# Patient Record
Sex: Male | Born: 1990
Health system: Southern US, Community
[De-identification: ages and names within clinical notes are randomized; demographics above are authoritative.]

## PROBLEM LIST (undated history)

## (undated) HISTORY — PX: HERNIA REPAIR: SHX51

---

## 2015-05-29 ENCOUNTER — Encounter: Payer: Self-pay | Admitting: Infectious Disease

## 2015-05-29 ENCOUNTER — Encounter (INDEPENDENT_AMBULATORY_CARE_PROVIDER_SITE_OTHER): Payer: Self-pay | Admitting: Infectious Disease

## 2015-05-29 VITALS — BP 117/73 | HR 73 | Temp 98.1°F | Resp 16 | Ht 71.0 in | Wt 123.2 lb

## 2015-05-29 DIAGNOSIS — Z006 Encounter for examination for normal comparison and control in clinical research program: Secondary | ICD-10-CM

## 2015-05-29 LAB — CBC WITH DIFFERENTIAL/PLATELET
BASOS ABS: 0 10*3/uL (ref 0.0–0.1)
BASOS PCT: 0 % (ref 0–1)
EOS ABS: 0.1 10*3/uL (ref 0.0–0.7)
Eosinophils Relative: 2 % (ref 0–5)
HEMATOCRIT: 43 % (ref 39.0–52.0)
HEMOGLOBIN: 14.3 g/dL (ref 13.0–17.0)
Lymphocytes Relative: 41 % (ref 12–46)
Lymphs Abs: 1.5 10*3/uL (ref 0.7–4.0)
MCH: 31.4 pg (ref 26.0–34.0)
MCHC: 33.3 g/dL (ref 30.0–36.0)
MCV: 94.3 fL (ref 78.0–100.0)
MPV: 9.5 fL (ref 8.6–12.4)
Monocytes Absolute: 0.4 10*3/uL (ref 0.1–1.0)
Monocytes Relative: 10 % (ref 3–12)
NEUTROS ABS: 1.7 10*3/uL (ref 1.7–7.7)
NEUTROS PCT: 47 % (ref 43–77)
Platelets: 185 10*3/uL (ref 150–400)
RBC: 4.56 MIL/uL (ref 4.22–5.81)
RDW: 12.8 % (ref 11.5–15.5)
WBC: 3.6 10*3/uL — ABNORMAL LOW (ref 4.0–10.5)

## 2015-05-29 NOTE — Progress Notes (Signed)
Chief complaint: evaluate for HPTN 083  Subjective:    Patient ID: Roy Wheeler, male    DOB: January 18, 1991, 25 y.o.   MRN: [id number]  HPI  25 year old young man here for CPE for HPTN 083.  He has been otherwise healthy. He had hx as child of a hernia repair but otherwise no significant medical history.  History reviewed. No pertinent past medical history.  Past Surgical History  Procedure Laterality Date  . Hernia repair      Family History  Problem Relation Age of Onset  . Autism Sister   . Asthma Father       Social History   Social History  . Marital Status: Single    Spouse Name: N/A  . Number of Children: N/A  . Years of Education: N/A   Social History Main Topics  . Smoking status: None  . Smokeless tobacco: None  . Alcohol Use: None  . Drug Use: None  . Sexual Activity: Not Asked   Other Topics Concern  . None   Social History Narrative  . None    No Known Allergies  No current outpatient prescriptions on file.   Review of Systems  Constitutional: Negative for fever, chills, diaphoresis, activity change, appetite change, fatigue and unexpected weight change.  HENT: Negative for congestion, rhinorrhea, sinus pressure, sneezing, sore throat and trouble swallowing.   Eyes: Negative for photophobia and visual disturbance.  Respiratory: Negative for cough, chest tightness, shortness of breath, wheezing and stridor.   Cardiovascular: Negative for chest pain, palpitations and leg swelling.  Gastrointestinal: Negative for nausea, vomiting, abdominal pain, diarrhea, constipation, blood in stool, abdominal distention and anal bleeding.  Genitourinary: Negative for dysuria, hematuria, flank pain and difficulty urinating.  Musculoskeletal: Negative for myalgias, back pain, joint swelling, arthralgias and gait problem.  Skin: Negative for color change, pallor, rash and wound.  Neurological: Negative for dizziness, tremors, weakness and light-headedness.    Hematological: Negative for adenopathy. Does not bruise/bleed easily.  Psychiatric/Behavioral: Negative for behavioral problems, confusion, sleep disturbance, dysphoric mood, decreased concentration and agitation.       Objective:   Physical Exam  Constitutional: He is oriented to person, place, and time. He appears well-developed and well-nourished.  HENT:  Head: Normocephalic and atraumatic.  Nose: Nose normal.  Mouth/Throat: Oropharynx is clear and moist. No oropharyngeal exudate.  Eyes: Conjunctivae and EOM are normal. Pupils are equal, round, and reactive to light. Right eye exhibits no discharge. Left eye exhibits no discharge. No scleral icterus.  Neck: Normal range of motion. Neck supple.  Cardiovascular: Normal rate, regular rhythm and normal heart sounds.  Exam reveals no gallop and no friction rub.   No murmur heard. Pulmonary/Chest: Effort normal and breath sounds normal. No respiratory distress. He has no wheezes. He has no rales. He exhibits no tenderness.  Abdominal: Soft. Bowel sounds are normal. He exhibits no distension. There is no tenderness. There is no rebound and no guarding.  Musculoskeletal: Normal range of motion. He exhibits no edema or tenderness.  Lymphadenopathy:       Head (right side): No submental, no submandibular, no tonsillar, no preauricular, no posterior auricular and no occipital adenopathy present.       Head (left side): No submental, no submandibular, no tonsillar, no preauricular, no posterior auricular and no occipital adenopathy present.    He has no cervical adenopathy.    He has no axillary adenopathy.       Right: No supraclavicular adenopathy present.  Left: No supraclavicular adenopathy present.  Neurological: He is alert and oriented to person, place, and time. He has normal reflexes. No cranial nerve deficit. Coordination normal.  Skin: Skin is warm and dry. No rash noted. No erythema. No pallor.  Psychiatric: He has a normal mood  and affect. His behavior is normal. Judgment and thought content normal.          Assessment & Plan:   Patient with completely normal exam. I also reviewed his EKG with NSR. He should be ready to enter study

## 2015-05-30 LAB — COMPLETE METABOLIC PANEL WITH GFR
ALT: 9 U/L (ref 9–46)
AST: 16 U/L (ref 10–40)
Albumin: 4.2 g/dL (ref 3.6–5.1)
Alkaline Phosphatase: 58 U/L (ref 40–115)
BILIRUBIN TOTAL: 0.5 mg/dL (ref 0.2–1.2)
BUN: 17 mg/dL (ref 7–25)
CHLORIDE: 105 mmol/L (ref 98–110)
CO2: 26 mmol/L (ref 20–31)
CREATININE: 0.79 mg/dL (ref 0.60–1.35)
Calcium: 9.3 mg/dL (ref 8.6–10.3)
GFR, Est Non African American: >89 mL/min (ref >=60)
Glucose, Bld: 71 mg/dL (ref 65–99)
Potassium: 3.9 mmol/L (ref 3.5–5.3)
Sodium: 142 mmol/L (ref 135–146)
TOTAL PROTEIN: 6.8 g/dL (ref 6.1–8.1)

## 2015-05-30 LAB — HEPATITIS C ANTIBODY: HCV AB: NEGATIVE

## 2015-05-30 LAB — HEPATITIS B SURFACE ANTIGEN: HEP B S AG: NEGATIVE

## 2015-05-30 LAB — HIV ANTIBODY (ROUTINE TESTING W REFLEX): HIV 1&2 Ab, 4th Generation: NONREACTIVE

## 2015-05-31 LAB — HIV-1 RNA QUANT-NO REFLEX-BLD

## 2015-06-04 NOTE — Progress Notes (Signed)
Study: A Phase 2b/3 Double Blind Safety and Efficacy Study of Injectable Cabotegravir compared to Daily Oral Tenofovir Disoproxil Fumarate/Emtricitabine (TDF/FTC), For Pre-Exposure Prophylaxis in HIV-Uninfected Cisgender Men and Transgender Women who have sex with Men.  Medication: Investigational Injectable Cabotegravir/placebo compared to Truvada/placebo. Duration: Around 4 years.  Roy Wheeler [name] is here for [name] screening visit. After verifying the correct version I explained/reviewed the informed consent in the language that he understood. Risk, benefits, responsibilities, and other options were reviewed. I answered his questions. Comprehension was assessed. He was given adequate time to consider his options. He verbalized understanding and signed the consent witnessed by me. I then gave him a copy of the consent.  HIV counseling was given including description of the testing and how it is done; explained HIV and how it is spread and ways to prevent it; Discussed the meaning of the possible test results and what impact the test results may have on the participant. PTID assigned. Confirmation of eligibility for screening was confirmed. Blood drawn and HIV rapid is negatvie. He received $50 gift card for screening visit. If deemed eligible and he is willing to participant in the study then anticipated entry visit is scheduled for 4/3. Tacey HeapElisha Kinsie Belford RN

## 2015-06-11 ENCOUNTER — Encounter (INDEPENDENT_AMBULATORY_CARE_PROVIDER_SITE_OTHER): Payer: Self-pay | Admitting: *Deleted

## 2015-06-11 VITALS — BP 135/80 | HR 91 | Temp 97.9°F | Resp 16 | Wt 121.5 lb

## 2015-06-11 DIAGNOSIS — Z006 Encounter for examination for normal comparison and control in clinical research program: Secondary | ICD-10-CM

## 2015-06-11 LAB — POCT URINALYSIS DIPSTICK
Bilirubin, UA: NEGATIVE
Blood, UA: NEGATIVE
GLUCOSE UA: NEGATIVE
KETONES UA: NEGATIVE
Nitrite, UA: NEGATIVE
PROTEIN UA: NEGATIVE
SPEC GRAV UA: 1.015
Urobilinogen, UA: 0.2
pH, UA: 7

## 2015-06-11 LAB — CBC WITH DIFFERENTIAL/PLATELET
BASOS PCT: 0 %
Basophils Absolute: 0 cells/uL (ref 0–200)
EOS PCT: 2 %
Eosinophils Absolute: 70 cells/uL (ref 15–500)
HCT: 45.9 % (ref 38.5–50.0)
Hemoglobin: 15.7 g/dL (ref 13.2–17.1)
LYMPHS PCT: 37 %
Lymphs Abs: 1295 cells/uL (ref 850–3900)
MCH: 32.6 pg (ref 27.0–33.0)
MCHC: 34.2 g/dL (ref 32.0–36.0)
MCV: 95.2 fL (ref 80.0–100.0)
MONOS PCT: 8 %
MPV: 9.8 fL (ref 7.5–12.5)
Monocytes Absolute: 280 cells/uL (ref 200–950)
NEUTROS ABS: 1855 {cells}/uL (ref 1500–7800)
Neutrophils Relative %: 53 %
PLATELETS: 174 10*3/uL (ref 140–400)
RBC: 4.82 MIL/uL (ref 4.20–5.80)
RDW: 12.6 % (ref 11.0–15.0)
WBC: 3.5 10*3/uL — AB (ref 3.8–10.8)

## 2015-06-11 LAB — PHOSPHORUS: PHOSPHORUS: 3.8 mg/dL (ref 2.5–4.5)

## 2015-06-11 LAB — COMPREHENSIVE METABOLIC PANEL
ALK PHOS: 60 U/L (ref 40–115)
ALT: 11 U/L (ref 9–46)
AST: 19 U/L (ref 10–40)
Albumin: 4.9 g/dL (ref 3.6–5.1)
BILIRUBIN TOTAL: 1 mg/dL (ref 0.2–1.2)
BUN: 15 mg/dL (ref 7–25)
CALCIUM: 9.5 mg/dL (ref 8.6–10.3)
CO2: 27 mmol/L (ref 20–31)
Chloride: 101 mmol/L (ref 98–110)
Creat: 0.78 mg/dL (ref 0.60–1.35)
GLUCOSE: 77 mg/dL (ref 65–99)
Potassium: 3.8 mmol/L (ref 3.5–5.3)
Sodium: 138 mmol/L (ref 135–146)
Total Protein: 7.6 g/dL (ref 6.1–8.1)

## 2015-06-11 LAB — LIPID PANEL
Cholesterol: 152 mg/dL (ref 125–200)
HDL: 46 mg/dL (ref >=40)
LDL CALC: 101 mg/dL (ref <130)
Total CHOL/HDL Ratio: 3.3 Ratio (ref <=5.0)
Triglycerides: 26 mg/dL (ref <150)
VLDL: 5 mg/dL (ref <30)

## 2015-06-11 LAB — CK: Total CK: 104 U/L (ref 7–232)

## 2015-06-11 LAB — HEPATITIS B CORE ANTIBODY, TOTAL: HEP B C TOTAL AB: NONREACTIVE

## 2015-06-11 LAB — HEPATITIS B SURFACE ANTIBODY,QUALITATIVE: HEP B S AB: POSITIVE — AB

## 2015-06-11 LAB — AMYLASE: AMYLASE: 55 U/L (ref 0–105)

## 2015-06-11 LAB — HIV ANTIBODY (ROUTINE TESTING W REFLEX): HIV: NONREACTIVE

## 2015-06-11 LAB — LIPASE: Lipase: 8 U/L (ref 7–60)

## 2015-06-11 NOTE — Progress Notes (Signed)
tStudy: A Phase 2b/3 Double Blind Safety and Efficacy Study of Injectable Cabotegravir compared to Daily Oral Tenofovir Disoproxil Fumarate/Emtricitabine (TDF/FTC), For Pre-Exposure Prophylaxis in HIV-Uninfected Cisgender Men and Transgender Women who have sex with Men.  Medication: Investigational Injectable Cabotegravir/placebo compared to Truvada/placebo. Duration: Around 4 years.  Roy Wheeler Roy Wheeler is here for HPTN 083 entry. After confirming eligibility and willingness to enroll we drew his blood. HIV rapid was negative. Questionnaires performed. He denies any new medications and/or symptoms. [facility] and potential side effects. He has my contact information should he have any questions/concerns. Study medication was dispensed. He received $50 giftcard + condoms and lube. Next appointment scheduled for Tuesday, April 18 at 10am. Tacey HeapElisha Epperson RN

## 2015-06-12 LAB — RPR TITER: RPR Titer: 1:8 {titer}

## 2015-06-12 LAB — FLUORESCENT TREPONEMAL AB(FTA)-IGG-BLD: Fluorescent Treponemal ABS: REACTIVE — AB

## 2015-06-12 LAB — GC/CHLAMYDIA PROBE AMP
CT PROBE, AMP APTIMA: NOT DETECTED
GC PROBE AMP APTIMA: NOT DETECTED

## 2015-06-12 LAB — RPR: RPR: REACTIVE — AB

## 2015-06-14 LAB — CT/NG RNA, TMA RECTAL
CHLAMYDIA TRACHOMATIS RNA: DETECTED — AB
NEISSERIA GONORRHOEAE RNA: DETECTED — AB

## 2015-06-15 ENCOUNTER — Encounter: Payer: Self-pay | Admitting: *Deleted

## 2015-06-15 NOTE — Progress Notes (Signed)
Patient ID: Roy Wheeler, male   DOB: April 22, 1990, 25 y.o.   MRN: [id number]  [facility] phone about his rectal GC/CT positive results. I explained his options for treatment and [facility] [phone]) for treatment. I gave him the number to make an appointment and faxed [phone]) the results to the health department. Orvan [name] stated the syphilis was known and that he was treated at the health department in November 2016. I noted this in the labs I sent to the health department. Tacey HeapElisha Sibbie Flammia RN

## 2015-06-15 NOTE — Progress Notes (Signed)
Thanks Elisha! 

## 2015-06-26 ENCOUNTER — Encounter (INDEPENDENT_AMBULATORY_CARE_PROVIDER_SITE_OTHER): Payer: Self-pay | Admitting: *Deleted

## 2015-06-26 VITALS — BP 112/74 | HR 67 | Temp 98.0°F | Wt 120.5 lb

## 2015-06-26 DIAGNOSIS — Z006 Encounter for examination for normal comparison and control in clinical research program: Secondary | ICD-10-CM

## 2015-06-26 LAB — CBC WITH DIFFERENTIAL/PLATELET
BASOS ABS: 0 {cells}/uL (ref 0–200)
Basophils Relative: 0 %
EOS ABS: 68 {cells}/uL (ref 15–500)
Eosinophils Relative: 2 %
HCT: 45.2 % (ref 38.5–50.0)
HEMOGLOBIN: 14.7 g/dL (ref 13.2–17.1)
Lymphocytes Relative: 48 %
Lymphs Abs: 1632 cells/uL (ref 850–3900)
MCH: 31.1 pg (ref 27.0–33.0)
MCHC: 32.5 g/dL (ref 32.0–36.0)
MCV: 95.6 fL (ref 80.0–100.0)
MONOS PCT: 7 %
MPV: 9.2 fL (ref 7.5–12.5)
Monocytes Absolute: 238 cells/uL (ref 200–950)
NEUTROS ABS: 1462 {cells}/uL — AB (ref 1500–7800)
Neutrophils Relative %: 43 %
Platelets: 199 10*3/uL (ref 140–400)
RBC: 4.73 MIL/uL (ref 4.20–5.80)
RDW: 12.7 % (ref 11.0–15.0)
WBC: 3.4 10*3/uL — ABNORMAL LOW (ref 3.8–10.8)

## 2015-06-26 NOTE — Progress Notes (Signed)
Study: A Phase 2b/3 Double Blind Safety and Efficacy Study of Injectable Cabotegravir compared to Daily Oral Tenofovir Disoproxil Fumarate/Emtricitabine (TDF/FTC), For Pre-Exposure Prophylaxis in HIV-Uninfected Cisgender Men and Transgender Women who have sex with Men.  Medication: Investigational Injectable Cabotegravir/placebo compared to Truvada/placebo. Duration: Around 4 years.  Roy [name] here for week 2 visit. Excellent adherence with medications. No missed doses. Pill count done and #45 remain of each study drug. Denies any adverse side effects from the medications. States that [facility] and received treatment for GC/CT. [facility]. No other complaints verbalized. Next visit scheduled for Jul 10, 2015 at 9:30am.

## 2015-06-27 LAB — COMPREHENSIVE METABOLIC PANEL
ALBUMIN: 4.8 g/dL (ref 3.6–5.1)
ALT: 13 U/L (ref 9–46)
AST: 21 U/L (ref 10–40)
Alkaline Phosphatase: 57 U/L (ref 40–115)
BUN: 18 mg/dL (ref 7–25)
CALCIUM: 9.3 mg/dL (ref 8.6–10.3)
CHLORIDE: 99 mmol/L (ref 98–110)
CO2: 29 mmol/L (ref 20–31)
Creat: 0.95 mg/dL (ref 0.60–1.35)
Glucose, Bld: 81 mg/dL (ref 65–99)
POTASSIUM: 3.6 mmol/L (ref 3.5–5.3)
Sodium: 138 mmol/L (ref 135–146)
TOTAL PROTEIN: 7.5 g/dL (ref 6.1–8.1)
Total Bilirubin: 0.8 mg/dL (ref 0.2–1.2)

## 2015-06-27 LAB — HIV ANTIBODY (ROUTINE TESTING W REFLEX): HIV 1&2 Ab, 4th Generation: NONREACTIVE

## 2015-06-27 LAB — AMYLASE: Amylase: 51 U/L (ref 0–105)

## 2015-06-27 LAB — PHOSPHORUS: PHOSPHORUS: 3.3 mg/dL (ref 2.5–4.5)

## 2015-06-27 LAB — LIPASE: LIPASE: 9 U/L (ref 7–60)

## 2015-06-27 LAB — CK: CK TOTAL: 92 U/L (ref 7–232)

## 2015-07-10 ENCOUNTER — Encounter (INDEPENDENT_AMBULATORY_CARE_PROVIDER_SITE_OTHER): Payer: Self-pay | Admitting: *Deleted

## 2015-07-10 VITALS — BP 118/79 | HR 68 | Temp 97.8°F | Wt 122.8 lb

## 2015-07-10 DIAGNOSIS — Z006 Encounter for examination for normal comparison and control in clinical research program: Secondary | ICD-10-CM

## 2015-07-10 LAB — CK: Total CK: 125 U/L (ref 7–232)

## 2015-07-10 LAB — CBC WITH DIFFERENTIAL/PLATELET
BASOS ABS: 0 {cells}/uL (ref 0–200)
Basophils Relative: 0 %
EOS PCT: 3 %
Eosinophils Absolute: 96 cells/uL (ref 15–500)
HEMATOCRIT: 43.1 % (ref 38.5–50.0)
HEMOGLOBIN: 14.8 g/dL (ref 13.2–17.1)
LYMPHS ABS: 1376 {cells}/uL (ref 850–3900)
LYMPHS PCT: 43 %
MCH: 32.5 pg (ref 27.0–33.0)
MCHC: 34.3 g/dL (ref 32.0–36.0)
MCV: 94.5 fL (ref 80.0–100.0)
MPV: 9.3 fL (ref 7.5–12.5)
Monocytes Absolute: 288 cells/uL (ref 200–950)
Monocytes Relative: 9 %
NEUTROS PCT: 45 %
Neutro Abs: 1440 cells/uL — ABNORMAL LOW (ref 1500–7800)
Platelets: 185 10*3/uL (ref 140–400)
RBC: 4.56 MIL/uL (ref 4.20–5.80)
RDW: 12.5 % (ref 11.0–15.0)
WBC: 3.2 10*3/uL — AB (ref 3.8–10.8)

## 2015-07-10 LAB — COMPREHENSIVE METABOLIC PANEL
ALK PHOS: 53 U/L (ref 40–115)
ALT: 12 U/L (ref 9–46)
AST: 20 U/L (ref 10–40)
Albumin: 4.4 g/dL (ref 3.6–5.1)
BUN: 14 mg/dL (ref 7–25)
CO2: 28 mmol/L (ref 20–31)
CREATININE: 0.9 mg/dL (ref 0.60–1.35)
Calcium: 9 mg/dL (ref 8.6–10.3)
Chloride: 103 mmol/L (ref 98–110)
GLUCOSE: 77 mg/dL (ref 65–99)
Potassium: 4 mmol/L (ref 3.5–5.3)
SODIUM: 139 mmol/L (ref 135–146)
Total Bilirubin: 0.7 mg/dL (ref 0.2–1.2)
Total Protein: 6.6 g/dL (ref 6.1–8.1)

## 2015-07-10 LAB — LIPASE: LIPASE: 12 U/L (ref 7–60)

## 2015-07-10 LAB — AMYLASE: AMYLASE: 40 U/L (ref 0–105)

## 2015-07-10 LAB — PHOSPHORUS: PHOSPHORUS: 3.7 mg/dL (ref 2.5–4.5)

## 2015-07-10 NOTE — Progress Notes (Signed)
Study: A Phase 2b/3 Double Blind Safety and Efficacy Study of Injectable Cabotegravir compared to Daily Oral Tenofovir Disoproxil Fumarate/Emtricitabine (TDF/FTC), For Pre-Exposure Prophylaxis in HIV-Uninfected Cisgender Men and Transgender Women who have sex with Men.  Medication: Investigational Injectable Cabotegravir/placebo compared to Truvada/placebo. Duration: Around 4 years.  Roy [name] here for his week 4 visit. Excellent adherence with study medication. Denies any missed doses. Pill count done, #30 remaining. Rapid HIV non-reactive. No new complaints or concerns verbalized. He will return next week for his first Cabotegrivir/placebo injection.

## 2015-07-11 LAB — HIV ANTIBODY (ROUTINE TESTING W REFLEX): HIV: NONREACTIVE

## 2015-07-16 ENCOUNTER — Encounter (INDEPENDENT_AMBULATORY_CARE_PROVIDER_SITE_OTHER): Payer: Self-pay | Admitting: *Deleted

## 2015-07-16 VITALS — BP 109/74 | HR 61 | Temp 98.2°F | Wt 120.0 lb

## 2015-07-16 DIAGNOSIS — Z006 Encounter for examination for normal comparison and control in clinical research program: Secondary | ICD-10-CM

## 2015-07-16 LAB — CBC WITH DIFFERENTIAL/PLATELET
BASOS PCT: 0 %
Basophils Absolute: 0 cells/uL (ref 0–200)
EOS ABS: 88 {cells}/uL (ref 15–500)
Eosinophils Relative: 2 %
HEMATOCRIT: 44.2 % (ref 38.5–50.0)
Hemoglobin: 14.8 g/dL (ref 13.2–17.1)
LYMPHS PCT: 29 %
Lymphs Abs: 1276 cells/uL (ref 850–3900)
MCH: 32 pg (ref 27.0–33.0)
MCHC: 33.5 g/dL (ref 32.0–36.0)
MCV: 95.5 fL (ref 80.0–100.0)
MONO ABS: 264 {cells}/uL (ref 200–950)
MONOS PCT: 6 %
MPV: 9.3 fL (ref 7.5–12.5)
NEUTROS PCT: 63 %
Neutro Abs: 2772 cells/uL (ref 1500–7800)
PLATELETS: 204 10*3/uL (ref 140–400)
RBC: 4.63 MIL/uL (ref 4.20–5.80)
RDW: 12.8 % (ref 11.0–15.0)
WBC: 4.4 10*3/uL (ref 3.8–10.8)

## 2015-07-16 NOTE — Progress Notes (Signed)
Study: A Phase 2b/3 Double Blind Safety and Efficacy Study of Injectable Cabotegravir compared to Daily Oral Tenofovir Disoproxil Fumarate/Emtricitabine (TDF/FTC), For Pre-Exposure Prophylaxis in HIV-Uninfected Cisgender Men and Transgender Women who have sex with Men.  Medication: Investigational Injectable Cabotegravir/placebo compared to Truvada/placebo. Duration: Around 4 years.  Orvan [name] here for week 5 visit.  No new complaints or concerns verbalized. Excellent adherence with oral study medication. No missed doses. Pill count done, #24 remaining of each medication. Rapid HIV non-reactive. He received his first Cabotegravir/placebo injection, to the (R) gluteal muscle. Site unremarkable. Reviewed possible s/sx's of injection site reactions. Next appointment scheduled for 5/16 @ 10:00am.

## 2015-07-17 LAB — COMPREHENSIVE METABOLIC PANEL
ALBUMIN: 4.4 g/dL (ref 3.6–5.1)
ALT: 10 U/L (ref 9–46)
AST: 19 U/L (ref 10–40)
Alkaline Phosphatase: 55 U/L (ref 40–115)
BILIRUBIN TOTAL: 0.7 mg/dL (ref 0.2–1.2)
BUN: 17 mg/dL (ref 7–25)
CO2: 28 mmol/L (ref 20–31)
CREATININE: 0.86 mg/dL (ref 0.60–1.35)
Calcium: 9.4 mg/dL (ref 8.6–10.3)
Chloride: 102 mmol/L (ref 98–110)
Glucose, Bld: 88 mg/dL (ref 65–99)
Potassium: 4 mmol/L (ref 3.5–5.3)
SODIUM: 139 mmol/L (ref 135–146)
TOTAL PROTEIN: 7 g/dL (ref 6.1–8.1)

## 2015-07-17 LAB — AMYLASE: Amylase: 44 U/L (ref 0–105)

## 2015-07-17 LAB — PHOSPHORUS: Phosphorus: 3.4 mg/dL (ref 2.5–4.5)

## 2015-07-17 LAB — CK: CK TOTAL: 113 U/L (ref 7–232)

## 2015-07-17 LAB — HIV ANTIBODY (ROUTINE TESTING W REFLEX): HIV 1&2 Ab, 4th Generation: NONREACTIVE

## 2015-07-17 LAB — LIPASE: Lipase: 7 U/L (ref 7–60)

## 2015-07-23 ENCOUNTER — Encounter (INDEPENDENT_AMBULATORY_CARE_PROVIDER_SITE_OTHER): Payer: Self-pay | Admitting: *Deleted

## 2015-07-23 VITALS — BP 106/70 | HR 91 | Temp 97.9°F | Wt 121.5 lb

## 2015-07-23 DIAGNOSIS — Z006 Encounter for examination for normal comparison and control in clinical research program: Secondary | ICD-10-CM

## 2015-07-23 LAB — CBC WITH DIFFERENTIAL/PLATELET
BASOS ABS: 0 {cells}/uL (ref 0–200)
Basophils Relative: 0 %
EOS PCT: 2 %
Eosinophils Absolute: 96 cells/uL (ref 15–500)
HCT: 41.4 % (ref 38.5–50.0)
HEMOGLOBIN: 13.9 g/dL (ref 13.2–17.1)
LYMPHS ABS: 1344 {cells}/uL (ref 850–3900)
Lymphocytes Relative: 28 %
MCH: 31.7 pg (ref 27.0–33.0)
MCHC: 33.6 g/dL (ref 32.0–36.0)
MCV: 94.3 fL (ref 80.0–100.0)
MPV: 9.3 fL (ref 7.5–12.5)
Monocytes Absolute: 336 cells/uL (ref 200–950)
Monocytes Relative: 7 %
NEUTROS ABS: 3024 {cells}/uL (ref 1500–7800)
Neutrophils Relative %: 63 %
Platelets: 198 10*3/uL (ref 140–400)
RBC: 4.39 MIL/uL (ref 4.20–5.80)
RDW: 12.3 % (ref 11.0–15.0)
WBC: 4.8 10*3/uL (ref 3.8–10.8)

## 2015-07-23 LAB — COMPREHENSIVE METABOLIC PANEL
ALBUMIN: 4.1 g/dL (ref 3.6–5.1)
ALK PHOS: 56 U/L (ref 40–115)
ALT: 9 U/L (ref 9–46)
AST: 18 U/L (ref 10–40)
BILIRUBIN TOTAL: 0.6 mg/dL (ref 0.2–1.2)
BUN: 14 mg/dL (ref 7–25)
CO2: 28 mmol/L (ref 20–31)
CREATININE: 0.76 mg/dL (ref 0.60–1.35)
Calcium: 8.8 mg/dL (ref 8.6–10.3)
Chloride: 103 mmol/L (ref 98–110)
Glucose, Bld: 86 mg/dL (ref 65–99)
Potassium: 3.9 mmol/L (ref 3.5–5.3)
SODIUM: 142 mmol/L (ref 135–146)
TOTAL PROTEIN: 6.5 g/dL (ref 6.1–8.1)

## 2015-07-23 LAB — CK: CK TOTAL: 74 U/L (ref 7–232)

## 2015-07-23 LAB — PHOSPHORUS: PHOSPHORUS: 2.5 mg/dL (ref 2.5–4.5)

## 2015-07-23 LAB — AMYLASE: Amylase: 42 U/L (ref 0–105)

## 2015-07-23 LAB — HIV ANTIBODY (ROUTINE TESTING W REFLEX): HIV 1&2 Ab, 4th Generation: NONREACTIVE

## 2015-07-23 LAB — LIPASE: Lipase: 11 U/L (ref 7–60)

## 2015-07-23 NOTE — Progress Notes (Signed)
Study: A Phase 2b/3 Double Blind Safety and Efficacy Study of Injectable Cabotegravir compared to Daily Oral Tenofovir Disoproxil Fumarate/Emtricitabine (TDF/FTC), For Pre-Exposure Prophylaxis in HIV-Uninfected Cisgender Men and Transgender Women who have sex with Men.  Medication: Investigational Injectable Cabotegravir/placebo compared to Truvada/placebo. Duration: Around 4 years.   Roy [name] here for week 6 visit. He did have some mild tenderness at the injection site which lasted only a day.  However, he did note tenderness again over the weekend at the injection site after playing with his cousins and "doing a cartwheel". States discomfort/tenderness resolved after taking ibuprofen. Denies any tenderness at today's visit.  No other concerns or complaint verbalized.  His next visit is scheduled for 6/6 at 10:00am.

## 2015-08-14 ENCOUNTER — Encounter (INDEPENDENT_AMBULATORY_CARE_PROVIDER_SITE_OTHER): Payer: Self-pay | Admitting: *Deleted

## 2015-08-14 VITALS — BP 118/74 | HR 76 | Temp 98.0°F | Wt 121.0 lb

## 2015-08-14 DIAGNOSIS — Z006 Encounter for examination for normal comparison and control in clinical research program: Secondary | ICD-10-CM

## 2015-08-14 LAB — COMPREHENSIVE METABOLIC PANEL
ALK PHOS: 67 U/L (ref 40–115)
ALT: 12 U/L (ref 9–46)
AST: 15 U/L (ref 10–40)
Albumin: 4.2 g/dL (ref 3.6–5.1)
BUN: 13 mg/dL (ref 7–25)
CHLORIDE: 101 mmol/L (ref 98–110)
CO2: 24 mmol/L (ref 20–31)
Calcium: 9 mg/dL (ref 8.6–10.3)
Creat: 0.84 mg/dL (ref 0.60–1.35)
GLUCOSE: 85 mg/dL (ref 65–99)
POTASSIUM: 3.8 mmol/L (ref 3.5–5.3)
Sodium: 138 mmol/L (ref 135–146)
Total Bilirubin: 0.5 mg/dL (ref 0.2–1.2)
Total Protein: 7.1 g/dL (ref 6.1–8.1)

## 2015-08-14 LAB — CBC WITH DIFFERENTIAL/PLATELET
BASOS ABS: 62 {cells}/uL (ref 0–200)
BASOS PCT: 1 %
EOS PCT: 4 %
Eosinophils Absolute: 248 cells/uL (ref 15–500)
HCT: 42.1 % (ref 38.5–50.0)
Hemoglobin: 14.2 g/dL (ref 13.2–17.1)
LYMPHS PCT: 26 %
Lymphs Abs: 1612 cells/uL (ref 850–3900)
MCH: 31.8 pg (ref 27.0–33.0)
MCHC: 33.7 g/dL (ref 32.0–36.0)
MCV: 94.2 fL (ref 80.0–100.0)
MONOS PCT: 9 %
MPV: 8.5 fL (ref 7.5–12.5)
Monocytes Absolute: 558 cells/uL (ref 200–950)
NEUTROS ABS: 3720 {cells}/uL (ref 1500–7800)
Neutrophils Relative %: 60 %
PLATELETS: 275 10*3/uL (ref 140–400)
RBC: 4.47 MIL/uL (ref 4.20–5.80)
RDW: 12.1 % (ref 11.0–15.0)
WBC: 6.2 10*3/uL (ref 3.8–10.8)

## 2015-08-14 LAB — AMYLASE: AMYLASE: 46 U/L (ref 0–105)

## 2015-08-14 LAB — PHOSPHORUS: Phosphorus: 3.2 mg/dL (ref 2.5–4.5)

## 2015-08-14 LAB — CK: CK TOTAL: 60 U/L (ref 7–232)

## 2015-08-14 LAB — LIPASE: Lipase: 15 U/L (ref 7–60)

## 2015-08-14 NOTE — Progress Notes (Signed)
Study: A Phase 2b/3 Double Blind Safety and Efficacy Study of Injectable Cabotegravir compared to Daily Oral Tenofovir Disoproxil Fumarate/Emtricitabine (TDF/FTC), For Pre-Exposure Prophylaxis in HIV-Uninfected Cisgender Men and Transgender Women who have sex with Men.  Medication: Investigational Injectable Cabotegravir/placebo compared to Truvada/placebo. Duration: Around 4 years.   Roy Wheeler is here for HPTN week 9. He has had a few days of constipation but has resolved. Now has hemorrhoids and is using Preparation H. HIV rapid is negative. Returned pills with #32 remaining. Questionnaires completed. Injection given in Left gluteal muscle with no problems. Oral study meds were dispensed. He received $50 gift card for visit along with condoms/lube. Next appointment is on Monday. Tacey HeapElisha Epperson RN

## 2015-08-15 LAB — HIV ANTIBODY (ROUTINE TESTING W REFLEX): HIV 1&2 Ab, 4th Generation: NONREACTIVE

## 2015-08-20 ENCOUNTER — Encounter (INDEPENDENT_AMBULATORY_CARE_PROVIDER_SITE_OTHER): Payer: Self-pay | Admitting: *Deleted

## 2015-08-20 VITALS — BP 101/64 | HR 85 | Temp 97.8°F | Wt 118.5 lb

## 2015-08-20 DIAGNOSIS — Z006 Encounter for examination for normal comparison and control in clinical research program: Secondary | ICD-10-CM

## 2015-08-20 LAB — CBC WITH DIFFERENTIAL/PLATELET
BASOS ABS: 0 {cells}/uL (ref 0–200)
Basophils Relative: 0 %
EOS ABS: 165 {cells}/uL (ref 15–500)
Eosinophils Relative: 3 %
HEMATOCRIT: 38.9 % (ref 38.5–50.0)
Hemoglobin: 13 g/dL — ABNORMAL LOW (ref 13.2–17.1)
LYMPHS PCT: 25 %
Lymphs Abs: 1375 cells/uL (ref 850–3900)
MCH: 30.9 pg (ref 27.0–33.0)
MCHC: 33.4 g/dL (ref 32.0–36.0)
MCV: 92.4 fL (ref 80.0–100.0)
MONO ABS: 440 {cells}/uL (ref 200–950)
MPV: 8.9 fL (ref 7.5–12.5)
Monocytes Relative: 8 %
NEUTROS PCT: 64 %
Neutro Abs: 3520 cells/uL (ref 1500–7800)
Platelets: 246 10*3/uL (ref 140–400)
RBC: 4.21 MIL/uL (ref 4.20–5.80)
RDW: 12.2 % (ref 11.0–15.0)
WBC: 5.5 10*3/uL (ref 3.8–10.8)

## 2015-08-20 LAB — COMPREHENSIVE METABOLIC PANEL
ALK PHOS: 64 U/L (ref 40–115)
ALT: 14 U/L (ref 9–46)
AST: 28 U/L (ref 10–40)
Albumin: 3.8 g/dL (ref 3.6–5.1)
BILIRUBIN TOTAL: 0.7 mg/dL (ref 0.2–1.2)
BUN: 13 mg/dL (ref 7–25)
CALCIUM: 9 mg/dL (ref 8.6–10.3)
CO2: 30 mmol/L (ref 20–31)
Chloride: 102 mmol/L (ref 98–110)
Creat: 0.83 mg/dL (ref 0.60–1.35)
Glucose, Bld: 99 mg/dL (ref 65–99)
POTASSIUM: 3.8 mmol/L (ref 3.5–5.3)
Sodium: 139 mmol/L (ref 135–146)
Total Protein: 7 g/dL (ref 6.1–8.1)

## 2015-08-20 LAB — LIPASE: Lipase: 6 U/L — ABNORMAL LOW (ref 7–60)

## 2015-08-20 LAB — PHOSPHORUS: PHOSPHORUS: 3.1 mg/dL (ref 2.5–4.5)

## 2015-08-20 LAB — AMYLASE: AMYLASE: 33 U/L (ref 0–105)

## 2015-08-20 LAB — CK: Total CK: 443 U/L — ABNORMAL HIGH (ref 7–232)

## 2015-08-20 NOTE — Progress Notes (Signed)
Study: A Phase 2b/3 Double Blind Safety and Efficacy Study of Injectable Cabotegravir compared to Daily Oral Tenofovir Disoproxil Fumarate/Emtricitabine (TDF/FTC), For Pre-Exposure Prophylaxis in HIV-Uninfected Cisgender Men and Transgender Women who have sex with Men.  Medication: Investigational Injectable Cabotegravir/placebo compared to Truvada/placebo. Duration: Around 4 years.   Roy Wheeler here for week 10 visit. He did have some mild tenderness at the injection site for approximately 4days. Stated improvement from discomfort with ibuprofen. No other complaints/concerns verbalized. Rapid HIV non-reactive. Scheduled to return in July for his next study visit/injection.

## 2015-08-21 LAB — HIV ANTIBODY (ROUTINE TESTING W REFLEX): HIV 1&2 Ab, 4th Generation: NONREACTIVE

## 2015-08-27 ENCOUNTER — Encounter (INDEPENDENT_AMBULATORY_CARE_PROVIDER_SITE_OTHER): Payer: Self-pay | Admitting: *Deleted

## 2015-08-27 DIAGNOSIS — Z006 Encounter for examination for normal comparison and control in clinical research program: Secondary | ICD-10-CM

## 2015-08-27 LAB — CK: Total CK: 85 U/L (ref 7–232)

## 2015-08-27 NOTE — Addendum Note (Signed)
Addended by: Nicolasa DuckingEPPERSON, ELISHA S on: 08/27/2015 09:37 AM   Modules accepted: Orders

## 2015-08-27 NOTE — Progress Notes (Signed)
Study: A Phase 2b/3 Double Blind Safety and Efficacy Study of Injectable Cabotegravir compared to Daily Oral Tenofovir Disoproxil Fumarate/Emtricitabine (TDF/FTC), For Pre-Exposure Prophylaxis in HIV-Uninfected Cisgender Men and Transgender Women who have sex with Men.  Medication: Investigational Injectable Cabotegravir/placebo compared to Truvada/placebo. Duration: Around 4 years.  Roy Wheeler is here for [facility] lab draw only. It was slightly elevated at last visit and we are repeating to see where the levels are today. He denies any tenderness or pain at injection site. He received $15 gift card for visit. Tacey HeapElisha Jerzey Komperda RN

## 2015-10-08 ENCOUNTER — Encounter (INDEPENDENT_AMBULATORY_CARE_PROVIDER_SITE_OTHER): Payer: Self-pay | Admitting: *Deleted

## 2015-10-08 VITALS — BP 107/75 | HR 93 | Temp 98.8°F | Wt 121.2 lb

## 2015-10-08 DIAGNOSIS — Z006 Encounter for examination for normal comparison and control in clinical research program: Secondary | ICD-10-CM

## 2015-10-08 LAB — CBC WITH DIFFERENTIAL/PLATELET
BASOS ABS: 0 {cells}/uL (ref 0–200)
BASOS PCT: 0 %
EOS ABS: 68 {cells}/uL (ref 15–500)
Eosinophils Relative: 1 %
HEMATOCRIT: 40.4 % (ref 38.5–50.0)
Hemoglobin: 13.6 g/dL (ref 13.2–17.1)
LYMPHS PCT: 24 %
Lymphs Abs: 1632 cells/uL (ref 850–3900)
MCH: 31.1 pg (ref 27.0–33.0)
MCHC: 33.7 g/dL (ref 32.0–36.0)
MCV: 92.2 fL (ref 80.0–100.0)
MONO ABS: 340 {cells}/uL (ref 200–950)
MPV: 9.1 fL (ref 7.5–12.5)
Monocytes Relative: 5 %
NEUTROS PCT: 70 %
Neutro Abs: 4760 cells/uL (ref 1500–7800)
Platelets: 245 10*3/uL (ref 140–400)
RBC: 4.38 MIL/uL (ref 4.20–5.80)
RDW: 12.4 % (ref 11.0–15.0)
WBC: 6.8 10*3/uL (ref 3.8–10.8)

## 2015-10-08 NOTE — Progress Notes (Signed)
Study: A Phase 2b/3 Double Blind Safety and Efficacy Study of Injectable Cabotegravir compared to Daily Oral Tenofovir Disoproxil Fumarate/Emtricitabine (TDF/FTC), For Pre-Exposure Prophylaxis in HIV-Uninfected Cisgender Men and Transgender Women who have sex with Men.  Medication: Investigational Injectable Cabotegravir/placebo compared to Truvada/placebo. Duration: Around 4 years.  Roy Wheeler is her for week 17. He has no new complaints and has no changes to his medications. He is now working 2 jobs and switched to taking his TDF/FTC/Placebo in the morning. #35 pills remain and states he missed one pill. Questionnaires and Interview completed. HIV rapid is negative. Medication was dispensed and injection given in Right Gluteal muscle with no problems. He received $50 gift card for visit. Next appointment scheduled for October 22, 2015. Roy Heap RN

## 2015-10-09 LAB — COMPREHENSIVE METABOLIC PANEL
ALBUMIN: 4.4 g/dL (ref 3.6–5.1)
ALT: 10 U/L (ref 9–46)
AST: 18 U/L (ref 10–40)
Alkaline Phosphatase: 65 U/L (ref 40–115)
BILIRUBIN TOTAL: 0.5 mg/dL (ref 0.2–1.2)
BUN: 15 mg/dL (ref 7–25)
CALCIUM: 9.2 mg/dL (ref 8.6–10.3)
CO2: 28 mmol/L (ref 20–31)
Chloride: 102 mmol/L (ref 98–110)
Creat: 0.88 mg/dL (ref 0.60–1.35)
Glucose, Bld: 67 mg/dL (ref 65–99)
POTASSIUM: 3.5 mmol/L (ref 3.5–5.3)
Sodium: 138 mmol/L (ref 135–146)
Total Protein: 7.1 g/dL (ref 6.1–8.1)

## 2015-10-09 LAB — PHOSPHORUS: PHOSPHORUS: 2.5 mg/dL (ref 2.5–4.5)

## 2015-10-09 LAB — CK: Total CK: 114 U/L (ref 7–232)

## 2015-10-09 LAB — LIPASE: LIPASE: 11 U/L (ref 7–60)

## 2015-10-09 LAB — AMYLASE: Amylase: 42 U/L (ref 0–105)

## 2015-10-09 LAB — HIV ANTIBODY (ROUTINE TESTING W REFLEX): HIV 1&2 Ab, 4th Generation: NONREACTIVE

## 2015-10-22 ENCOUNTER — Encounter (INDEPENDENT_AMBULATORY_CARE_PROVIDER_SITE_OTHER): Payer: Self-pay | Admitting: *Deleted

## 2015-10-22 VITALS — BP 104/69 | HR 81 | Temp 97.9°F | Wt 120.5 lb

## 2015-10-22 DIAGNOSIS — Z006 Encounter for examination for normal comparison and control in clinical research program: Secondary | ICD-10-CM

## 2015-10-22 LAB — CBC WITH DIFFERENTIAL/PLATELET
BASOS PCT: 0 %
Basophils Absolute: 0 cells/uL (ref 0–200)
EOS PCT: 1 %
Eosinophils Absolute: 51 cells/uL (ref 15–500)
HEMATOCRIT: 39.2 % (ref 38.5–50.0)
HEMOGLOBIN: 13.1 g/dL — AB (ref 13.2–17.1)
LYMPHS ABS: 2193 {cells}/uL (ref 850–3900)
Lymphocytes Relative: 43 %
MCH: 31.2 pg (ref 27.0–33.0)
MCHC: 33.4 g/dL (ref 32.0–36.0)
MCV: 93.3 fL (ref 80.0–100.0)
MPV: 8.8 fL (ref 7.5–12.5)
Monocytes Absolute: 459 cells/uL (ref 200–950)
Monocytes Relative: 9 %
NEUTROS ABS: 2397 {cells}/uL (ref 1500–7800)
NEUTROS PCT: 47 %
Platelets: 257 10*3/uL (ref 140–400)
RBC: 4.2 MIL/uL (ref 4.20–5.80)
RDW: 12.4 % (ref 11.0–15.0)
WBC: 5.1 10*3/uL (ref 3.8–10.8)

## 2015-10-22 LAB — COMPREHENSIVE METABOLIC PANEL
ALK PHOS: 62 U/L (ref 40–115)
ALT: 10 U/L (ref 9–46)
AST: 17 U/L (ref 10–40)
Albumin: 4.2 g/dL (ref 3.6–5.1)
BILIRUBIN TOTAL: 0.4 mg/dL (ref 0.2–1.2)
BUN: 13 mg/dL (ref 7–25)
CALCIUM: 9.2 mg/dL (ref 8.6–10.3)
CO2: 31 mmol/L (ref 20–31)
Chloride: 103 mmol/L (ref 98–110)
Creat: 1.04 mg/dL (ref 0.60–1.35)
GLUCOSE: 75 mg/dL (ref 65–99)
Potassium: 4.3 mmol/L (ref 3.5–5.3)
Sodium: 139 mmol/L (ref 135–146)
Total Protein: 7.3 g/dL (ref 6.1–8.1)

## 2015-10-22 LAB — CK: Total CK: 93 U/L (ref 7–232)

## 2015-10-22 LAB — PHOSPHORUS: Phosphorus: 3.3 mg/dL (ref 2.5–4.5)

## 2015-10-22 LAB — AMYLASE: Amylase: 43 U/L (ref 0–105)

## 2015-10-22 LAB — LIPASE: Lipase: 12 U/L (ref 7–60)

## 2015-10-22 NOTE — Progress Notes (Signed)
Study: A Phase 2b/3 Double Blind Safety and Efficacy Study of Injectable Cabotegravir compared to Daily Oral Tenofovir Disoproxil Fumarate/Emtricitabine (TDF/FTC), For Pre-Exposure Prophylaxis in HIV-Uninfected Cisgender Men and Transgender Women who have sex with Men.  Medication: Investigational Injectable Cabotegravir/placebo compared to Truvada/placebo. Duration: Around 4 years.   Orvan [name] here for week 19 visit. Verbalized mild tenderness post injection. Denied any redness, swelling or bruising at site. No other complaints verbalized. No missed doses of his oral study medication. Rapid HIV non-reactive. Next visit scheduled for 9/25 @ 10:30am.

## 2015-10-23 LAB — HIV ANTIBODY (ROUTINE TESTING W REFLEX): HIV: NONREACTIVE

## 2015-10-31 ENCOUNTER — Telehealth: Payer: Self-pay

## 2015-12-03 ENCOUNTER — Encounter (INDEPENDENT_AMBULATORY_CARE_PROVIDER_SITE_OTHER): Payer: Self-pay | Admitting: *Deleted

## 2015-12-03 VITALS — BP 105/70 | HR 64 | Temp 97.4°F | Wt 119.5 lb

## 2015-12-03 DIAGNOSIS — Z006 Encounter for examination for normal comparison and control in clinical research program: Secondary | ICD-10-CM

## 2015-12-03 LAB — CBC WITH DIFFERENTIAL/PLATELET
Basophils Absolute: 0 cells/uL (ref 0–200)
Basophils Relative: 0 %
EOS ABS: 36 {cells}/uL (ref 15–500)
Eosinophils Relative: 1 %
HEMATOCRIT: 38.9 % (ref 38.5–50.0)
Hemoglobin: 13.1 g/dL — ABNORMAL LOW (ref 13.2–17.1)
LYMPHS PCT: 34 %
Lymphs Abs: 1224 cells/uL (ref 850–3900)
MCH: 31 pg (ref 27.0–33.0)
MCHC: 33.7 g/dL (ref 32.0–36.0)
MCV: 92.2 fL (ref 80.0–100.0)
MONO ABS: 216 {cells}/uL (ref 200–950)
MONOS PCT: 6 %
MPV: 9.5 fL (ref 7.5–12.5)
NEUTROS PCT: 59 %
Neutro Abs: 2124 cells/uL (ref 1500–7800)
Platelets: 192 10*3/uL (ref 140–400)
RBC: 4.22 MIL/uL (ref 4.20–5.80)
RDW: 12.8 % (ref 11.0–15.0)
WBC: 3.6 10*3/uL — ABNORMAL LOW (ref 3.8–10.8)

## 2015-12-03 LAB — COMPREHENSIVE METABOLIC PANEL
ALBUMIN: 4.3 g/dL (ref 3.6–5.1)
ALT: 10 U/L (ref 9–46)
AST: 18 U/L (ref 10–40)
Alkaline Phosphatase: 50 U/L (ref 40–115)
BILIRUBIN TOTAL: 0.7 mg/dL (ref 0.2–1.2)
BUN: 15 mg/dL (ref 7–25)
CALCIUM: 9.3 mg/dL (ref 8.6–10.3)
CHLORIDE: 105 mmol/L (ref 98–110)
CO2: 29 mmol/L (ref 20–31)
Creat: 0.85 mg/dL (ref 0.60–1.35)
GLUCOSE: 85 mg/dL (ref 65–99)
Potassium: 3.5 mmol/L (ref 3.5–5.3)
Sodium: 142 mmol/L (ref 135–146)
Total Protein: 7 g/dL (ref 6.1–8.1)

## 2015-12-03 LAB — AMYLASE: Amylase: 39 U/L (ref 0–105)

## 2015-12-03 LAB — CK: Total CK: 130 U/L (ref 7–232)

## 2015-12-03 LAB — LIPASE: Lipase: 10 U/L (ref 7–60)

## 2015-12-03 LAB — PHOSPHORUS: Phosphorus: 3.5 mg/dL (ref 2.5–4.5)

## 2015-12-03 LAB — HIV ANTIBODY (ROUTINE TESTING W REFLEX): HIV 1&2 Ab, 4th Generation: NONREACTIVE

## 2015-12-03 NOTE — Progress Notes (Signed)
Study: A Phase 2b/3 Double Blind Safety and Efficacy Study of Injectable Cabotegravir compared to Daily Oral Tenofovir Disoproxil Fumarate/Emtricitabine (TDF/FTC), For Pre-Exposure Prophylaxis in HIV-Uninfected Cisgender Men and Transgender Women who have sex with Men.  Medication: Investigational Injectable Cabotegravir/placebo compared to Truvada/placebo. Duration: Around 4 years.  Orvan [name] is here for week 25 visit. No new complaints or concerns verbalized. Rapid HIV non-reactive. Cabotegravir/placebo injection given (L) gluteal muscle. Site unremarkable. States that he did miss 1 dose of his oral study medication otherwise excellent adherence. Next visit scheduled for 10/9 @ 9:00am.

## 2015-12-17 ENCOUNTER — Encounter (INDEPENDENT_AMBULATORY_CARE_PROVIDER_SITE_OTHER): Payer: Self-pay | Admitting: *Deleted

## 2015-12-17 VITALS — BP 108/69 | HR 76 | Temp 97.9°F | Wt 117.0 lb

## 2015-12-17 DIAGNOSIS — Z006 Encounter for examination for normal comparison and control in clinical research program: Secondary | ICD-10-CM

## 2015-12-17 LAB — COMPREHENSIVE METABOLIC PANEL
ALBUMIN: 4.2 g/dL (ref 3.6–5.1)
ALT: 10 U/L (ref 9–46)
AST: 16 U/L (ref 10–40)
Alkaline Phosphatase: 58 U/L (ref 40–115)
BILIRUBIN TOTAL: 0.6 mg/dL (ref 0.2–1.2)
BUN: 14 mg/dL (ref 7–25)
CALCIUM: 9.2 mg/dL (ref 8.6–10.3)
CO2: 28 mmol/L (ref 20–31)
Chloride: 104 mmol/L (ref 98–110)
Creat: 0.91 mg/dL (ref 0.60–1.35)
Glucose, Bld: 83 mg/dL (ref 65–99)
Potassium: 3.8 mmol/L (ref 3.5–5.3)
Sodium: 142 mmol/L (ref 135–146)
Total Protein: 7.2 g/dL (ref 6.1–8.1)

## 2015-12-17 LAB — CBC WITH DIFFERENTIAL/PLATELET
BASOS PCT: 0 %
Basophils Absolute: 0 cells/uL (ref 0–200)
Eosinophils Absolute: 82 cells/uL (ref 15–500)
Eosinophils Relative: 2 %
HEMATOCRIT: 39.8 % (ref 38.5–50.0)
Hemoglobin: 13.1 g/dL — ABNORMAL LOW (ref 13.2–17.1)
LYMPHS PCT: 37 %
Lymphs Abs: 1517 cells/uL (ref 850–3900)
MCH: 31 pg (ref 27.0–33.0)
MCHC: 32.9 g/dL (ref 32.0–36.0)
MCV: 94.1 fL (ref 80.0–100.0)
MONO ABS: 328 {cells}/uL (ref 200–950)
MPV: 9.3 fL (ref 7.5–12.5)
Monocytes Relative: 8 %
NEUTROS ABS: 2173 {cells}/uL (ref 1500–7800)
Neutrophils Relative %: 53 %
PLATELETS: 248 10*3/uL (ref 140–400)
RBC: 4.23 MIL/uL (ref 4.20–5.80)
RDW: 12.8 % (ref 11.0–15.0)
WBC: 4.1 10*3/uL (ref 3.8–10.8)

## 2015-12-17 LAB — AMYLASE: Amylase: 47 U/L (ref 0–105)

## 2015-12-17 LAB — LIPASE: LIPASE: 14 U/L (ref 7–60)

## 2015-12-17 LAB — CK: Total CK: 83 U/L (ref 7–232)

## 2015-12-17 LAB — PHOSPHORUS: PHOSPHORUS: 3.5 mg/dL (ref 2.5–4.5)

## 2015-12-17 NOTE — Progress Notes (Signed)
Study: A Phase 2b/3 Double Blind Safety and Efficacy Study of Injectable Cabotegravir compared to Daily Oral Tenofovir Disoproxil Fumarate/Emtricitabine (TDF/FTC), For Pre-Exposure Prophylaxis in HIV-Uninfected Cisgender Men and Transgender Women who have sex with Men.  Medication: Investigational Injectable Cabotegravir/placebo compared to Truvada/placebo. Duration: Around 4 years.  Orvan [name] is here for week 27. Did have a day of tenderness after injection. Lasted a day and no treatment actions needed. HIV Rapid is negative. He received $50 gift card and condoms. Next appt is 11/20. Tacey HeapElisha Nelline Lio RN

## 2015-12-18 LAB — HIV ANTIBODY (ROUTINE TESTING W REFLEX): HIV 1&2 Ab, 4th Generation: NONREACTIVE

## 2016-01-28 ENCOUNTER — Encounter (INDEPENDENT_AMBULATORY_CARE_PROVIDER_SITE_OTHER): Payer: Self-pay | Admitting: *Deleted

## 2016-01-28 VITALS — BP 112/76 | HR 85 | Temp 97.4°F | Wt 124.0 lb

## 2016-01-28 DIAGNOSIS — Z006 Encounter for examination for normal comparison and control in clinical research program: Secondary | ICD-10-CM

## 2016-01-28 LAB — HIV ANTIBODY (ROUTINE TESTING W REFLEX): HIV 1&2 Ab, 4th Generation: NONREACTIVE

## 2016-01-28 LAB — CBC WITH DIFFERENTIAL/PLATELET
BASOS ABS: 0 {cells}/uL (ref 0–200)
Basophils Relative: 0 %
EOS ABS: 51 {cells}/uL (ref 15–500)
EOS PCT: 1 %
HCT: 44 % (ref 38.5–50.0)
HEMOGLOBIN: 14.5 g/dL (ref 13.2–17.1)
LYMPHS ABS: 1581 {cells}/uL (ref 850–3900)
Lymphocytes Relative: 31 %
MCH: 32.4 pg (ref 27.0–33.0)
MCHC: 33 g/dL (ref 32.0–36.0)
MCV: 98.4 fL (ref 80.0–100.0)
MPV: 9.4 fL (ref 7.5–12.5)
Monocytes Absolute: 306 cells/uL (ref 200–950)
Monocytes Relative: 6 %
NEUTROS ABS: 3162 {cells}/uL (ref 1500–7800)
Neutrophils Relative %: 62 %
Platelets: 227 10*3/uL (ref 140–400)
RBC: 4.47 MIL/uL (ref 4.20–5.80)
RDW: 12.5 % (ref 11.0–15.0)
WBC: 5.1 10*3/uL (ref 3.8–10.8)

## 2016-01-28 LAB — AMYLASE: AMYLASE: 53 U/L (ref 0–105)

## 2016-01-28 LAB — LIPASE: LIPASE: 18 U/L (ref 7–60)

## 2016-01-28 LAB — PHOSPHORUS: Phosphorus: 4.2 mg/dL (ref 2.5–4.5)

## 2016-01-28 LAB — COMPREHENSIVE METABOLIC PANEL
ALBUMIN: 4.6 g/dL (ref 3.6–5.1)
ALK PHOS: 62 U/L (ref 40–115)
ALT: 9 U/L (ref 9–46)
AST: 18 U/L (ref 10–40)
BUN: 15 mg/dL (ref 7–25)
CHLORIDE: 102 mmol/L (ref 98–110)
CO2: 29 mmol/L (ref 20–31)
CREATININE: 0.91 mg/dL (ref 0.60–1.35)
Calcium: 9.2 mg/dL (ref 8.6–10.3)
Glucose, Bld: 100 mg/dL — ABNORMAL HIGH (ref 65–99)
Potassium: 3.8 mmol/L (ref 3.5–5.3)
SODIUM: 141 mmol/L (ref 135–146)
TOTAL PROTEIN: 7.7 g/dL (ref 6.1–8.1)
Total Bilirubin: 0.5 mg/dL (ref 0.2–1.2)

## 2016-01-28 LAB — CK: Total CK: 85 U/L (ref 7–232)
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
## 2016-01-29 LAB — GC/CHLAMYDIA PROBE AMP
CT Probe RNA: NOT DETECTED
GC Probe RNA: NOT DETECTED

## 2016-01-29 LAB — RPR

## 2016-01-31 LAB — CT/NG RNA, TMA RECTAL
Chlamydia Trachomatis RNA: DETECTED — AB
Neisseria Gonorrhoeae RNA: NOT DETECTED

## 2016-02-04 ENCOUNTER — Telehealth: Payer: Self-pay | Admitting: *Deleted

## 2016-02-04 NOTE — Telephone Encounter (Signed)
Notified Daniele of positive rectal chlamydia result. Discussed treatment options. Verbalized understanding. Per Dr. Daiva EvesVan Dam called in azithromycin [facility]g single [facility]

## 2016-02-05 ENCOUNTER — Other Ambulatory Visit: Payer: Self-pay | Admitting: *Deleted

## 2016-02-05 MED ORDER — AZITHROMYCIN 1 G PO PACK: 1.0000 g | PACK | Freq: Once | ORAL | 0 refills | Status: DC

## 2016-02-05 MED ORDER — AZITHROMYCIN 1 G PO PACK: 1.0000 g | PACK | Freq: Once | ORAL | 0 refills | Status: AC

## 2016-02-11 ENCOUNTER — Encounter (INDEPENDENT_AMBULATORY_CARE_PROVIDER_SITE_OTHER): Payer: Self-pay | Admitting: *Deleted

## 2016-02-11 VITALS — BP 111/69 | HR 66 | Temp 97.3°F | Wt 123.8 lb

## 2016-02-11 DIAGNOSIS — Z006 Encounter for examination for normal comparison and control in clinical research program: Secondary | ICD-10-CM

## 2016-02-11 LAB — CBC WITH DIFFERENTIAL/PLATELET
BASOS PCT: 0 %
Basophils Absolute: 0 cells/uL (ref 0–200)
EOS ABS: 41 {cells}/uL (ref 15–500)
EOS PCT: 1 %
HCT: 40.8 % (ref 38.5–50.0)
HEMOGLOBIN: 13.7 g/dL (ref 13.2–17.1)
LYMPHS ABS: 1476 {cells}/uL (ref 850–3900)
Lymphocytes Relative: 36 %
MCH: 32.5 pg (ref 27.0–33.0)
MCHC: 33.6 g/dL (ref 32.0–36.0)
MCV: 96.7 fL (ref 80.0–100.0)
MONOS PCT: 6 %
MPV: 8.9 fL (ref 7.5–12.5)
Monocytes Absolute: 246 cells/uL (ref 200–950)
NEUTROS ABS: 2337 {cells}/uL (ref 1500–7800)
Neutrophils Relative %: 57 %
PLATELETS: 217 10*3/uL (ref 140–400)
RBC: 4.22 MIL/uL (ref 4.20–5.80)
RDW: 12.6 % (ref 11.0–15.0)
WBC: 4.1 10*3/uL (ref 3.8–10.8)

## 2016-02-11 NOTE — Progress Notes (Signed)
Study: A Phase 2b/3 Double Blind Safety and Efficacy Study of Injectable Cabotegravir compared to Daily Oral Tenofovir Disoproxil Fumarate/Emtricitabine (TDF/FTC), For Pre-Exposure Prophylaxis in HIV-Uninfected Cisgender Men and Transgender Women who have sex with Men.  Medication: Investigational Injectable Cabotegravir/placebo compared to Truvada/placebo. Duration: Around 4 years.  Roy Wheeler Roy Wheeler is here for week 35 visit. Denies any tenderness, pain, swelling, redness or bruising at injection site post injection. States that he completed treatment for positive rectal CT on 02/05/16.  We did discuss safe sex practices and the importance of condom use. Agreed and verbalized understanding. Condoms and lube provided. No new complaints or concerns verbalized. Next visit scheduled for 03/27/16 @ 9:30am.

## 2016-02-12 LAB — COMPREHENSIVE METABOLIC PANEL
ALBUMIN: 4.2 g/dL (ref 3.6–5.1)
ALK PHOS: 56 U/L (ref 40–115)
ALT: 12 U/L (ref 9–46)
AST: 19 U/L (ref 10–40)
BILIRUBIN TOTAL: 0.6 mg/dL (ref 0.2–1.2)
BUN: 14 mg/dL (ref 7–25)
CO2: 27 mmol/L (ref 20–31)
CREATININE: 0.83 mg/dL (ref 0.60–1.35)
Calcium: 9.2 mg/dL (ref 8.6–10.3)
Chloride: 105 mmol/L (ref 98–110)
Glucose, Bld: 88 mg/dL (ref 65–99)
Potassium: 3.7 mmol/L (ref 3.5–5.3)
SODIUM: 141 mmol/L (ref 135–146)
TOTAL PROTEIN: 7.2 g/dL (ref 6.1–8.1)

## 2016-02-12 LAB — AMYLASE: AMYLASE: 47 U/L (ref 0–105)

## 2016-02-12 LAB — CK: CK TOTAL: 95 U/L (ref 7–232)

## 2016-02-12 LAB — HIV ANTIBODY (ROUTINE TESTING W REFLEX): HIV: NONREACTIVE

## 2016-02-12 LAB — LIPASE: Lipase: 11 U/L (ref 7–60)

## 2016-02-12 LAB — PHOSPHORUS: PHOSPHORUS: 3.3 mg/dL (ref 2.5–4.5)

## 2016-04-10 ENCOUNTER — Encounter (INDEPENDENT_AMBULATORY_CARE_PROVIDER_SITE_OTHER): Payer: Self-pay | Admitting: *Deleted

## 2016-04-10 VITALS — BP 119/83 | HR 73 | Temp 98.1°F | Wt 124.0 lb

## 2016-04-10 DIAGNOSIS — Z006 Encounter for examination for normal comparison and control in clinical research program: Secondary | ICD-10-CM

## 2016-04-10 LAB — CBC WITH DIFFERENTIAL/PLATELET
BASOS ABS: 0 {cells}/uL (ref 0–200)
Basophils Relative: 0 %
EOS ABS: 68 {cells}/uL (ref 15–500)
Eosinophils Relative: 2 %
HEMATOCRIT: 42.7 % (ref 38.5–50.0)
Hemoglobin: 14.5 g/dL (ref 13.2–17.1)
LYMPHS PCT: 42 %
Lymphs Abs: 1428 cells/uL (ref 850–3900)
MCH: 32 pg (ref 27.0–33.0)
MCHC: 34 g/dL (ref 32.0–36.0)
MCV: 94.3 fL (ref 80.0–100.0)
MONO ABS: 170 {cells}/uL — AB (ref 200–950)
MONOS PCT: 5 %
MPV: 9.2 fL (ref 7.5–12.5)
NEUTROS PCT: 51 %
Neutro Abs: 1734 cells/uL (ref 1500–7800)
PLATELETS: 189 10*3/uL (ref 140–400)
RBC: 4.53 MIL/uL (ref 4.20–5.80)
RDW: 12.4 % (ref 11.0–15.0)
WBC: 3.4 10*3/uL — ABNORMAL LOW (ref 3.8–10.8)

## 2016-04-10 LAB — COMPREHENSIVE METABOLIC PANEL
ALBUMIN: 4.5 g/dL (ref 3.6–5.1)
ALK PHOS: 54 U/L (ref 40–115)
ALT: 11 U/L (ref 9–46)
AST: 19 U/L (ref 10–40)
BILIRUBIN TOTAL: 0.9 mg/dL (ref 0.2–1.2)
BUN: 16 mg/dL (ref 7–25)
CALCIUM: 9.1 mg/dL (ref 8.6–10.3)
CO2: 26 mmol/L (ref 20–31)
Chloride: 103 mmol/L (ref 98–110)
Creat: 0.97 mg/dL (ref 0.60–1.35)
Glucose, Bld: 78 mg/dL (ref 65–99)
POTASSIUM: 3.5 mmol/L (ref 3.5–5.3)
Sodium: 139 mmol/L (ref 135–146)
Total Protein: 7.3 g/dL (ref 6.1–8.1)

## 2016-04-10 LAB — PHOSPHORUS: PHOSPHORUS: 2.9 mg/dL (ref 2.5–4.5)

## 2016-04-10 NOTE — Progress Notes (Signed)
Study: A Phase 2b/3 Double Blind Safety and Efficacy Study of Injectable Cabotegravir compared to Daily Oral Tenofovir Disoproxil Fumarate/Emtricitabine (TDF/FTC), For Pre-Exposure Prophylaxis in HIV-Uninfected Cisgender Men and Transgender Women who have sex with Men.  Medication: Investigational Injectable Cabotegravir/placebo compared to Truvada/placebo. Duration: Around 4 years.  Roy Wheeler is here for week 41. He reports no issues since last study visit. Blood drawn with no problems and HIV rapid confirmed non-reactive. Questionnaires completed. #19 pills returned of oral study products. Injection given in right gluteal muscle with no problems. He received $50 gift card and condoms. Next appt scheduled for 2/6 @ 10am. Tacey HeapElisha Epperson RN

## 2016-04-11 LAB — HIV ANTIBODY (ROUTINE TESTING W REFLEX): HIV 1&2 Ab, 4th Generation: NONREACTIVE

## 2016-04-11 LAB — AMYLASE: Amylase: 49 U/L (ref 0–105)

## 2016-04-11 LAB — CK: Total CK: 82 U/L (ref 7–232)

## 2016-04-11 LAB — LIPASE: LIPASE: 9 U/L (ref 7–60)

## 2016-04-15 ENCOUNTER — Encounter (INDEPENDENT_AMBULATORY_CARE_PROVIDER_SITE_OTHER): Payer: Self-pay | Admitting: *Deleted

## 2016-04-15 VITALS — BP 117/77 | HR 76 | Temp 98.3°F | Wt 127.2 lb

## 2016-04-15 DIAGNOSIS — Z006 Encounter for examination for normal comparison and control in clinical research program: Secondary | ICD-10-CM

## 2016-04-15 LAB — COMPREHENSIVE METABOLIC PANEL
ALBUMIN: 4.3 g/dL (ref 3.6–5.1)
ALT: 10 U/L (ref 9–46)
AST: 17 U/L (ref 10–40)
Alkaline Phosphatase: 59 U/L (ref 40–115)
BILIRUBIN TOTAL: 0.4 mg/dL (ref 0.2–1.2)
BUN: 17 mg/dL (ref 7–25)
CALCIUM: 8.9 mg/dL (ref 8.6–10.3)
CO2: 28 mmol/L (ref 20–31)
CREATININE: 0.88 mg/dL (ref 0.60–1.35)
Chloride: 105 mmol/L (ref 98–110)
Glucose, Bld: 82 mg/dL (ref 65–99)
Potassium: 3.9 mmol/L (ref 3.5–5.3)
Sodium: 139 mmol/L (ref 135–146)
Total Protein: 6.7 g/dL (ref 6.1–8.1)

## 2016-04-15 LAB — CBC WITH DIFFERENTIAL/PLATELET
BASOS ABS: 0 {cells}/uL (ref 0–200)
BASOS PCT: 0 %
EOS ABS: 66 {cells}/uL (ref 15–500)
Eosinophils Relative: 1 %
HEMATOCRIT: 43 % (ref 38.5–50.0)
Hemoglobin: 14.3 g/dL (ref 13.2–17.1)
Lymphocytes Relative: 22 %
Lymphs Abs: 1452 cells/uL (ref 850–3900)
MCH: 32 pg (ref 27.0–33.0)
MCHC: 33.3 g/dL (ref 32.0–36.0)
MCV: 96.2 fL (ref 80.0–100.0)
MONO ABS: 462 {cells}/uL (ref 200–950)
MONOS PCT: 7 %
MPV: 9.4 fL (ref 7.5–12.5)
NEUTROS ABS: 4620 {cells}/uL (ref 1500–7800)
Neutrophils Relative %: 70 %
Platelets: 195 10*3/uL (ref 140–400)
RBC: 4.47 MIL/uL (ref 4.20–5.80)
RDW: 12.5 % (ref 11.0–15.0)
WBC: 6.6 10*3/uL (ref 3.8–10.8)

## 2016-04-15 LAB — AMYLASE: Amylase: 48 U/L (ref 0–105)

## 2016-04-15 LAB — PHOSPHORUS: Phosphorus: 3.4 mg/dL (ref 2.5–4.5)

## 2016-04-15 LAB — CK: Total CK: 86 U/L (ref 7–232)

## 2016-04-15 LAB — LIPASE: LIPASE: 16 U/L (ref 7–60)

## 2016-04-15 NOTE — Progress Notes (Signed)
Study: A Phase 2b/3 Double Blind Safety and Efficacy Study of Injectable Cabotegravir compared to Daily Oral Tenofovir Disoproxil Fumarate/Emtricitabine (TDF/FTC), For Pre-Exposure Prophylaxis in HIV-Uninfected Cisgender Men and Transgender Women who have sex with Men.  Medication: Investigational Injectable Cabotegravir/placebo compared to Truvada/placebo. Duration: Around 4 years.  Roy Wheeler [name] is here for wk 43. He had 1 day of injection site tenderness and was mild. Blood was drawn and HIV rapid confirmed non-reactive. He received $50 gift card for visit and condoms were dispensed. Next appt scheduled for 3/13 @ 10am. Tacey HeapElisha Epperson RN

## 2016-04-16 LAB — HIV ANTIBODY (ROUTINE TESTING W REFLEX): HIV 1&2 Ab, 4th Generation: NONREACTIVE

## 2016-05-17 ENCOUNTER — Ambulatory Visit (HOSPITAL_COMMUNITY)
Admission: EM | Admit: 2016-05-17 | Discharge: 2016-05-17 | Disposition: A | Discharge status: Home / Self Care | Payer: BLUE CROSS/BLUE SHIELD | Attending: Internal Medicine | Admitting: Internal Medicine

## 2016-05-17 ENCOUNTER — Encounter (HOSPITAL_COMMUNITY): Payer: Self-pay | Admitting: *Deleted

## 2016-05-17 DIAGNOSIS — R059 Cough, unspecified: Secondary | ICD-10-CM

## 2016-05-17 DIAGNOSIS — R05 Cough: Secondary | ICD-10-CM | POA: Diagnosis not present

## 2016-05-17 MED ORDER — PREDNISONE 10 MG PO TABS: 40.0000 mg | ORAL_TABLET | Freq: Every day | ORAL | 0 refills | Status: AC

## 2016-05-17 MED ORDER — BENZONATATE 100 MG PO CAPS: 100.0000 mg | ORAL_CAPSULE | Freq: Three times a day (TID) | ORAL | 0 refills | Status: AC

## 2016-05-17 NOTE — ED Provider Notes (Signed)
CSN: [id number]     Arrival date & time 05/17/16  1220 History   First MD Initiated Contact with Patient 05/17/16 1343     Chief Complaint  Patient presents with  . Cough   (Consider location/radiation/quality/duration/timing/severity/associated sxs/prior Treatment) 26 y.o. male presents with nonproductive cough  X 2 weeks. Patient states that approxiately 1 month earlier herhad a URP with a productive cough that self resolved but now is waht he describes as "dry cough"  That wakes him up. Condition is acute in nature. Condition is made better by nothing. Condition is made worse by nothing. Patient denies treatment prior to there arrival at this facility. Patient denies any fevers or current URI symptoms       History reviewed. No pertinent past medical history. Past Surgical History:  Procedure Laterality Date  . HERNIA REPAIR     Family History  Problem Relation Age of Onset  . Autism Sister   . Asthma Father    Social History  Substance Use Topics  . Smoking status: Never Smoker  . Smokeless tobacco: Never Used  . Alcohol use Yes     Comment: rarely    Review of Systems  Constitutional: Negative for chills and fever.  HENT: Negative for ear pain and sore throat.   Eyes: Negative for pain and visual disturbance.  Respiratory: Negative for cough and shortness of breath.   Cardiovascular: Negative for chest pain and palpitations.  Gastrointestinal: Negative for abdominal pain and vomiting.  Genitourinary: Negative for dysuria and hematuria.  Musculoskeletal: Negative for arthralgias and back pain.  Skin: Negative for color change and rash.  Neurological: Negative for seizures and syncope.  All other systems reviewed and are negative.   Allergies  Patient has no known allergies.  Home Medications   Prior to Admission medications   Medication Sig Start Date End Date Taking? Authorizing Provider  Multiple Vitamin (MULTIVITAMIN) capsule Take 1 capsule by mouth daily.    Yes Historical Provider, MD  UNKNOWN TO PATIENT Med "to prevent HIV"   Yes Historical Provider, MD  benzonatate (TESSALON) 100 MG capsule Take 1 capsule (100 mg total) by mouth every 8 (eight) hours. 05/17/16   Alene MiresJennifer C Omohundro, NP  predniSONE (DELTASONE) 10 MG tablet Take 4 tablets (40 mg total) by mouth daily with breakfast. 05/17/16 05/22/16  Alene MiresJennifer C Omohundro, NP   Meds Ordered and Administered this Visit  Medications - No data to display  BP 126/65   Pulse 72   Temp 98.5 F (36.9 C) (Oral)   Resp 16   SpO2 100%  No data found.   Physical Exam  Constitutional: He is oriented to person, place, and time. He appears well-developed and well-nourished.  HENT:  Head: Normocephalic.  Neck: Normal range of motion.  Cardiovascular: Normal rate and regular rhythm.   Pulmonary/Chest: Effort normal and breath sounds normal.  Musculoskeletal: Normal range of motion.  Neurological: He is alert and oriented to person, place, and time.  Skin: Skin is dry.  Psychiatric: He has a normal mood and affect.  Nursing note and vitals reviewed.   Urgent Care Course     Procedures (including critical care time)  Labs Review Labs Reviewed - No data to display  Imaging Review No results found.        MDM   1. Cough        Alene MiresJennifer C Omohundro, NP 05/17/16 1354

## 2016-05-17 NOTE — ED Triage Notes (Signed)
Taking OTC cough med.

## 2016-05-17 NOTE — ED Triage Notes (Signed)
C/O cough x 1 month that was initially productive, now dry.  Denies fevers.

## 2016-05-17 NOTE — Discharge Instructions (Signed)
Continue to push fluids contact this department if URI symptoms return.

## 2016-05-20 ENCOUNTER — Encounter (INDEPENDENT_AMBULATORY_CARE_PROVIDER_SITE_OTHER): Payer: BLUE CROSS/BLUE SHIELD | Admitting: *Deleted

## 2016-05-20 VITALS — BP 111/70 | HR 64 | Temp 98.4°F | Wt 126.5 lb

## 2016-05-20 DIAGNOSIS — Z006 Encounter for examination for normal comparison and control in clinical research program: Secondary | ICD-10-CM

## 2016-05-20 LAB — CBC WITH DIFFERENTIAL/PLATELET
BASOS ABS: 0 {cells}/uL (ref 0–200)
Basophils Relative: 0 %
EOS PCT: 0 %
Eosinophils Absolute: 0 cells/uL — ABNORMAL LOW (ref 15–500)
HCT: 42.6 % (ref 38.5–50.0)
Hemoglobin: 14.1 g/dL (ref 13.2–17.1)
Lymphocytes Relative: 36 %
Lymphs Abs: 2304 cells/uL (ref 850–3900)
MCH: 32 pg (ref 27.0–33.0)
MCHC: 33.1 g/dL (ref 32.0–36.0)
MCV: 96.8 fL (ref 80.0–100.0)
MONOS PCT: 7 %
MPV: 9.4 fL (ref 7.5–12.5)
Monocytes Absolute: 448 cells/uL (ref 200–950)
NEUTROS ABS: 3648 {cells}/uL (ref 1500–7800)
Neutrophils Relative %: 57 %
PLATELETS: 219 10*3/uL (ref 140–400)
RBC: 4.4 MIL/uL (ref 4.20–5.80)
RDW: 12.5 % (ref 11.0–15.0)
WBC: 6.4 10*3/uL (ref 3.8–10.8)

## 2016-05-20 LAB — PHOSPHORUS: Phosphorus: 3.8 mg/dL (ref 2.5–4.5)

## 2016-05-20 LAB — COMPREHENSIVE METABOLIC PANEL
ALBUMIN: 4.4 g/dL (ref 3.6–5.1)
ALK PHOS: 53 U/L (ref 40–115)
ALT: 11 U/L (ref 9–46)
AST: 17 U/L (ref 10–40)
BILIRUBIN TOTAL: 0.7 mg/dL (ref 0.2–1.2)
BUN: 16 mg/dL (ref 7–25)
CO2: 29 mmol/L (ref 20–31)
CREATININE: 0.95 mg/dL (ref 0.60–1.35)
Calcium: 9 mg/dL (ref 8.6–10.3)
Chloride: 105 mmol/L (ref 98–110)
Glucose, Bld: 89 mg/dL (ref 65–99)
Potassium: 3.6 mmol/L (ref 3.5–5.3)
SODIUM: 142 mmol/L (ref 135–146)
TOTAL PROTEIN: 7 g/dL (ref 6.1–8.1)

## 2016-05-20 LAB — LIPASE: Lipase: 16 U/L (ref 7–60)

## 2016-05-20 LAB — CK: CK TOTAL: 85 U/L (ref 7–232)

## 2016-05-20 LAB — AMYLASE: Amylase: 49 U/L (ref 0–105)

## 2016-05-20 NOTE — Progress Notes (Signed)
Roy Wheeler is here for his week 49 study visit for Study: A Phase 2b/3 Double Blind Safety and Efficacy Study of Injectable Cabotegravir compared to Daily Oral Tenofovir Disoproxil Fumarate/Emtricitabine (TDF/FTC), For Pre-Exposure Prophylaxis in HIV-Uninfected Cisgender Men and Transgender Women who have sex with Men.  Medication: Investigational Injectable Cabotegravir/placebo compared to Truvada/placebo. Duration: Around 4 years. He says he has been having a lingering dry cough since he came down with a cold over a month ago. He went to urgent care on 3/10 and was given prednisone and tessalon perles. After his rapid test was verified negative, he was given an injection of cabotegravir/placebo in his rt butt. His adherence has been excellent, maybe only missing 1 tablet. He is to return in 2 weeks for the safety visit.

## 2016-05-21 LAB — HIV ANTIBODY (ROUTINE TESTING W REFLEX): HIV: NONREACTIVE

## 2016-06-02 ENCOUNTER — Encounter (INDEPENDENT_AMBULATORY_CARE_PROVIDER_SITE_OTHER): Payer: Self-pay | Admitting: *Deleted

## 2016-06-02 VITALS — BP 106/74 | HR 89 | Temp 97.9°F | Wt 123.5 lb

## 2016-06-02 DIAGNOSIS — Z006 Encounter for examination for normal comparison and control in clinical research program: Secondary | ICD-10-CM

## 2016-06-02 LAB — COMPREHENSIVE METABOLIC PANEL
ALT: 14 U/L (ref 9–46)
AST: 18 U/L (ref 10–40)
Albumin: 4.3 g/dL (ref 3.6–5.1)
Alkaline Phosphatase: 62 U/L (ref 40–115)
BUN: 14 mg/dL (ref 7–25)
CHLORIDE: 103 mmol/L (ref 98–110)
CO2: 31 mmol/L (ref 20–31)
CREATININE: 0.88 mg/dL (ref 0.60–1.35)
Calcium: 9.4 mg/dL (ref 8.6–10.3)
Glucose, Bld: 109 mg/dL — ABNORMAL HIGH (ref 65–99)
Potassium: 4.3 mmol/L (ref 3.5–5.3)
SODIUM: 141 mmol/L (ref 135–146)
Total Bilirubin: 0.5 mg/dL (ref 0.2–1.2)
Total Protein: 7.1 g/dL (ref 6.1–8.1)

## 2016-06-02 LAB — CBC WITH DIFFERENTIAL/PLATELET
BASOS ABS: 0 {cells}/uL (ref 0–200)
Basophils Relative: 0 %
Eosinophils Absolute: 41 cells/uL (ref 15–500)
Eosinophils Relative: 1 %
HCT: 43.3 % (ref 38.5–50.0)
Hemoglobin: 14.7 g/dL (ref 13.2–17.1)
LYMPHS PCT: 30 %
Lymphs Abs: 1230 cells/uL (ref 850–3900)
MCH: 32.7 pg (ref 27.0–33.0)
MCHC: 33.9 g/dL (ref 32.0–36.0)
MCV: 96.4 fL (ref 80.0–100.0)
MPV: 9 fL (ref 7.5–12.5)
Monocytes Absolute: 246 cells/uL (ref 200–950)
Monocytes Relative: 6 %
NEUTROS PCT: 63 %
Neutro Abs: 2583 cells/uL (ref 1500–7800)
PLATELETS: 238 10*3/uL (ref 140–400)
RBC: 4.49 MIL/uL (ref 4.20–5.80)
RDW: 12.2 % (ref 11.0–15.0)
WBC: 4.1 10*3/uL (ref 3.8–10.8)

## 2016-06-02 LAB — CK: Total CK: 61 U/L (ref 7–232)

## 2016-06-02 LAB — HIV ANTIBODY (ROUTINE TESTING W REFLEX): HIV 1&2 Ab, 4th Generation: NONREACTIVE

## 2016-06-02 LAB — PHOSPHORUS: Phosphorus: 3.5 mg/dL (ref 2.5–4.5)

## 2016-06-02 LAB — AMYLASE: Amylase: 58 U/L (ref 0–105)

## 2016-06-02 LAB — LIPASE: LIPASE: 16 U/L (ref 7–60)

## 2016-06-02 NOTE — Progress Notes (Signed)
Study: A Phase 2b/3 Double Blind Safety and Efficacy Study of Injectable Cabotegravir compared to Daily Oral Tenofovir Disoproxil Fumarate/Emtricitabine (TDF/FTC), For Pre-Exposure Prophylaxis in HIV-Uninfected Cisgender Men and Transgender Women who have sex with Men.  Medication: Investigational Injectable Cabotegravir/placebo compared to Truvada/placebo. Duration: Around 4 years.  Orvan [name] is here for week 51 visit. Did have some mild tenderness X 2 days after last injection.Took ibuprofen with relief. No other complaints or concerns verbalized. Rapid HIV non-reactive. Condoms and lube provided. He will return in May for his next injection visit.

## 2016-07-16 ENCOUNTER — Encounter (INDEPENDENT_AMBULATORY_CARE_PROVIDER_SITE_OTHER): Payer: BLUE CROSS/BLUE SHIELD | Admitting: *Deleted

## 2016-07-16 VITALS — BP 113/75 | HR 69 | Temp 98.3°F | Wt 127.0 lb

## 2016-07-16 DIAGNOSIS — Z006 Encounter for examination for normal comparison and control in clinical research program: Secondary | ICD-10-CM

## 2016-07-16 LAB — CBC WITH DIFFERENTIAL/PLATELET
BASOS ABS: 0 {cells}/uL (ref 0–200)
Basophils Relative: 0 %
EOS PCT: 2 %
Eosinophils Absolute: 92 cells/uL (ref 15–500)
HCT: 42.4 % (ref 38.5–50.0)
Hemoglobin: 13.9 g/dL (ref 13.2–17.1)
LYMPHS PCT: 27 %
Lymphs Abs: 1242 cells/uL (ref 850–3900)
MCH: 31.5 pg (ref 27.0–33.0)
MCHC: 32.8 g/dL (ref 32.0–36.0)
MCV: 96.1 fL (ref 80.0–100.0)
MONOS PCT: 5 %
MPV: 9.1 fL (ref 7.5–12.5)
Monocytes Absolute: 230 cells/uL (ref 200–950)
NEUTROS PCT: 66 %
Neutro Abs: 3036 cells/uL (ref 1500–7800)
PLATELETS: 204 10*3/uL (ref 140–400)
RBC: 4.41 MIL/uL (ref 4.20–5.80)
RDW: 12.5 % (ref 11.0–15.0)
WBC: 4.6 10*3/uL (ref 3.8–10.8)

## 2016-07-16 LAB — COMPREHENSIVE METABOLIC PANEL
ALT: 12 U/L (ref 9–46)
AST: 20 U/L (ref 10–40)
Albumin: 4.2 g/dL (ref 3.6–5.1)
Alkaline Phosphatase: 49 U/L (ref 40–115)
BUN: 11 mg/dL (ref 7–25)
CHLORIDE: 103 mmol/L (ref 98–110)
CO2: 28 mmol/L (ref 20–31)
Calcium: 9.1 mg/dL (ref 8.6–10.3)
Creat: 0.93 mg/dL (ref 0.60–1.35)
GLUCOSE: 81 mg/dL (ref 65–99)
POTASSIUM: 3.8 mmol/L (ref 3.5–5.3)
Sodium: 140 mmol/L (ref 135–146)
TOTAL PROTEIN: 6.7 g/dL (ref 6.1–8.1)
Total Bilirubin: 0.7 mg/dL (ref 0.2–1.2)

## 2016-07-16 LAB — LIPID PANEL
CHOL/HDL RATIO: 3.6 ratio (ref <5.0)
CHOLESTEROL: 135 mg/dL (ref <200)
HDL: 38 mg/dL — ABNORMAL LOW (ref >40)
LDL CALC: 88 mg/dL (ref <100)
Triglycerides: 44 mg/dL (ref <150)
VLDL: 9 mg/dL (ref <30)

## 2016-07-16 LAB — PHOSPHORUS: PHOSPHORUS: 3.4 mg/dL (ref 2.5–4.5)

## 2016-07-16 NOTE — Progress Notes (Signed)
Study: A Phase 2b/3 Double Blind Safety and Efficacy Study of Injectable Cabotegravir compared to Daily Oral Tenofovir Disoproxil Fumarate/Emtricitabine (TDF/FTC), For Pre-Exposure Prophylaxis in HIV-Uninfected Cisgender Men and Transgender Women who have sex with Men.  Medication: Investigational Injectable Cabotegravir/placebo compared to Truvada/placebo. Duration: Around 4 years.  Roy Wheeler is here for week 57. He reports no new issues since last study visit. Questionnaires completed. HIV rapid confirmed non-reacted. He returned #33 pills of oral study product. Injection given in right gluteal muscle with no problem. He received $50 gift card for visit. Next appointment scheduled for 07/30/17.

## 2016-07-17 LAB — URINALYSIS
BILIRUBIN URINE: NEGATIVE
GLUCOSE, UA: NEGATIVE
HGB URINE DIPSTICK: NEGATIVE
Ketones, ur: NEGATIVE
Nitrite: NEGATIVE
PROTEIN: NEGATIVE
Specific Gravity, Urine: 1.013 (ref 1.001–1.035)
pH: 7.5 (ref 5.0–8.0)

## 2016-07-17 LAB — GC/CHLAMYDIA PROBE AMP
CT PROBE, AMP APTIMA: NOT DETECTED
GC Probe RNA: NOT DETECTED

## 2016-07-17 LAB — LIPASE: Lipase: 8 U/L (ref 7–60)

## 2016-07-17 LAB — RPR

## 2016-07-17 LAB — HEPATITIS C ANTIBODY: HCV Ab: NEGATIVE

## 2016-07-17 LAB — HIV ANTIBODY (ROUTINE TESTING W REFLEX): HIV 1&2 Ab, 4th Generation: NONREACTIVE

## 2016-07-17 LAB — AMYLASE: AMYLASE: 43 U/L (ref 0–105)

## 2016-07-17 LAB — CK: CK TOTAL: 189 U/L (ref 7–232)

## 2016-07-19 LAB — CT/NG RNA, TMA RECTAL
Chlamydia Trachomatis RNA: NOT DETECTED
NEISSERIA GONORRHOEAE RNA: NOT DETECTED

## 2016-07-30 ENCOUNTER — Encounter (INDEPENDENT_AMBULATORY_CARE_PROVIDER_SITE_OTHER): Payer: BLUE CROSS/BLUE SHIELD | Admitting: *Deleted

## 2016-07-30 VITALS — BP 109/76 | HR 76 | Temp 97.3°F | Wt 125.0 lb

## 2016-07-30 DIAGNOSIS — Z006 Encounter for examination for normal comparison and control in clinical research program: Secondary | ICD-10-CM

## 2016-07-30 LAB — COMPREHENSIVE METABOLIC PANEL
ALT: 12 U/L (ref 9–46)
AST: 19 U/L (ref 10–40)
Albumin: 4.4 g/dL (ref 3.6–5.1)
Alkaline Phosphatase: 55 U/L (ref 40–115)
BUN: 16 mg/dL (ref 7–25)
CHLORIDE: 104 mmol/L (ref 98–110)
CO2: 28 mmol/L (ref 20–31)
CREATININE: 1.01 mg/dL (ref 0.60–1.35)
Calcium: 9 mg/dL (ref 8.6–10.3)
Glucose, Bld: 94 mg/dL (ref 65–99)
POTASSIUM: 3.6 mmol/L (ref 3.5–5.3)
Sodium: 140 mmol/L (ref 135–146)
Total Bilirubin: 0.6 mg/dL (ref 0.2–1.2)
Total Protein: 6.7 g/dL (ref 6.1–8.1)

## 2016-07-30 LAB — CBC WITH DIFFERENTIAL/PLATELET
BASOS ABS: 0 {cells}/uL (ref 0–200)
Basophils Relative: 0 %
EOS ABS: 68 {cells}/uL (ref 15–500)
Eosinophils Relative: 2 %
HCT: 41.3 % (ref 38.5–50.0)
HEMOGLOBIN: 13.6 g/dL (ref 13.2–17.1)
LYMPHS ABS: 1360 {cells}/uL (ref 850–3900)
Lymphocytes Relative: 40 %
MCH: 31.5 pg (ref 27.0–33.0)
MCHC: 32.9 g/dL (ref 32.0–36.0)
MCV: 95.6 fL (ref 80.0–100.0)
MONO ABS: 306 {cells}/uL (ref 200–950)
MPV: 9.1 fL (ref 7.5–12.5)
Monocytes Relative: 9 %
NEUTROS ABS: 1666 {cells}/uL (ref 1500–7800)
Neutrophils Relative %: 49 %
Platelets: 219 10*3/uL (ref 140–400)
RBC: 4.32 MIL/uL (ref 4.20–5.80)
RDW: 12.1 % (ref 11.0–15.0)
WBC: 3.4 10*3/uL — ABNORMAL LOW (ref 3.8–10.8)

## 2016-07-30 LAB — HIV ANTIBODY (ROUTINE TESTING W REFLEX): HIV 1&2 Ab, 4th Generation: NONREACTIVE

## 2016-07-30 LAB — CK: CK TOTAL: 127 U/L (ref 44–196)

## 2016-07-30 LAB — AMYLASE: Amylase: 49 U/L (ref 21–101)

## 2016-07-30 LAB — PHOSPHORUS: Phosphorus: 3.2 mg/dL (ref 2.5–4.5)

## 2016-07-30 LAB — LIPASE: Lipase: 17 U/L (ref 7–60)

## 2016-07-30 NOTE — Progress Notes (Signed)
Study: A Phase 2b/3 Double Blind Safety and Efficacy Study of Injectable Cabotegravir compared to Daily Oral Tenofovir Disoproxil Fumarate/Emtricitabine (TDF/FTC), For Pre-Exposure Prophylaxis in HIV-Uninfected Cisgender Men and Transgender Women who have sex with Men.  Medication: Investigational Injectable Cabotegravir/placebo compared to Truvada/placebo. Duration: Around 4 years.  Roy Wheeler is here for week 59. He denies any injection site reaction. He denies any changes since last study visit. Questionnaire completed. He received $50 giftcard for visit. Next appointment scheduled for 6/28.

## 2016-09-04 ENCOUNTER — Encounter (INDEPENDENT_AMBULATORY_CARE_PROVIDER_SITE_OTHER): Payer: BLUE CROSS/BLUE SHIELD | Admitting: *Deleted

## 2016-09-04 VITALS — BP 112/72 | HR 60 | Temp 98.4°F | Wt 124.5 lb

## 2016-09-04 DIAGNOSIS — Z006 Encounter for examination for normal comparison and control in clinical research program: Secondary | ICD-10-CM

## 2016-09-04 LAB — PHOSPHORUS: Phosphorus: 2.6 mg/dL (ref 2.5–4.5)

## 2016-09-04 LAB — CBC WITH DIFFERENTIAL/PLATELET
BASOS PCT: 0 %
Basophils Absolute: 0 cells/uL (ref 0–200)
EOS PCT: 1 %
Eosinophils Absolute: 30 cells/uL (ref 15–500)
HCT: 41 % (ref 38.5–50.0)
Hemoglobin: 13.5 g/dL (ref 13.2–17.1)
Lymphocytes Relative: 38 %
Lymphs Abs: 1140 cells/uL (ref 850–3900)
MCH: 31.3 pg (ref 27.0–33.0)
MCHC: 32.9 g/dL (ref 32.0–36.0)
MCV: 95.1 fL (ref 80.0–100.0)
MONOS PCT: 7 %
MPV: 9.5 fL (ref 7.5–12.5)
Monocytes Absolute: 210 cells/uL (ref 200–950)
NEUTROS ABS: 1620 {cells}/uL (ref 1500–7800)
Neutrophils Relative %: 54 %
PLATELETS: 188 10*3/uL (ref 140–400)
RBC: 4.31 MIL/uL (ref 4.20–5.80)
RDW: 12.2 % (ref 11.0–15.0)
WBC: 3 10*3/uL — ABNORMAL LOW (ref 3.8–10.8)

## 2016-09-04 LAB — CK: CK TOTAL: 87 U/L (ref 44–196)

## 2016-09-04 LAB — COMPREHENSIVE METABOLIC PANEL
ALK PHOS: 53 U/L (ref 40–115)
ALT: 10 U/L (ref 9–46)
AST: 17 U/L (ref 10–40)
Albumin: 4.4 g/dL (ref 3.6–5.1)
BILIRUBIN TOTAL: 0.7 mg/dL (ref 0.2–1.2)
BUN: 17 mg/dL (ref 7–25)
CO2: 26 mmol/L (ref 20–31)
Calcium: 9 mg/dL (ref 8.6–10.3)
Chloride: 105 mmol/L (ref 98–110)
Creat: 0.91 mg/dL (ref 0.60–1.35)
GLUCOSE: 85 mg/dL (ref 65–99)
POTASSIUM: 3.5 mmol/L (ref 3.5–5.3)
Sodium: 138 mmol/L (ref 135–146)
Total Protein: 6.7 g/dL (ref 6.1–8.1)

## 2016-09-04 LAB — LIPASE: Lipase: 15 U/L (ref 7–60)

## 2016-09-04 LAB — HIV ANTIBODY (ROUTINE TESTING W REFLEX): HIV 1&2 Ab, 4th Generation: NONREACTIVE

## 2016-09-04 LAB — AMYLASE: AMYLASE: 47 U/L (ref 21–101)

## 2016-09-04 NOTE — Progress Notes (Signed)
Study: A Phase 2b/3 Double Blind Safety and Efficacy Study of Injectable Cabotegravir compared to Daily Oral Tenofovir Disoproxil Fumarate/Emtricitabine (TDF/FTC), For Pre-Exposure Prophylaxis in HIV-Uninfected Cisgender Men and Transgender Women who have sex with Men.  Medication: Investigational Injectable Cabotegravir/placebo compared to Truvada/placebo. Duration: Around 4 years.  Roy Wheeler Roy Wheeler is here for week 65. Assessment unchanged since last study visit. He returned #39 oral study products. Questionnaires completed. HIV rapid confirmed to be non-reactive. Oral study product dispensed. Injection given in right gluteal muscle with no problems. Oral study product dispensed. He received $50 gift card for visit. Next appointment scheduled for 09/15/16.

## 2016-09-04 NOTE — Progress Notes (Signed)
Met with ppt in exam room to introduce self and role as Community Outreach Coordinator. Ppt has an active mychart account and uses the app as well. Ppt gave permission to send mail; confirmed mailing address. Ppt would like text appt reminders. Ppt is interested in an information session/testing event at their church. Gave ppt business card. 

## 2016-09-15 ENCOUNTER — Encounter (INDEPENDENT_AMBULATORY_CARE_PROVIDER_SITE_OTHER): Payer: BLUE CROSS/BLUE SHIELD | Admitting: *Deleted

## 2016-09-15 VITALS — BP 103/65 | HR 71 | Temp 97.6°F | Wt 122.8 lb

## 2016-09-15 DIAGNOSIS — Z006 Encounter for examination for normal comparison and control in clinical research program: Secondary | ICD-10-CM

## 2016-09-15 LAB — PHOSPHORUS: Phosphorus: 3.6 mg/dL (ref 2.5–4.5)

## 2016-09-15 LAB — CBC WITH DIFFERENTIAL/PLATELET
BASOS ABS: 0 {cells}/uL (ref 0–200)
Basophils Relative: 0 %
Eosinophils Absolute: 36 cells/uL (ref 15–500)
Eosinophils Relative: 1 %
HCT: 41.5 % (ref 38.5–50.0)
HEMOGLOBIN: 14.1 g/dL (ref 13.2–17.1)
LYMPHS ABS: 1512 {cells}/uL (ref 850–3900)
LYMPHS PCT: 42 %
MCH: 32.1 pg (ref 27.0–33.0)
MCHC: 34 g/dL (ref 32.0–36.0)
MCV: 94.5 fL (ref 80.0–100.0)
MONO ABS: 288 {cells}/uL (ref 200–950)
MPV: 9.4 fL (ref 7.5–12.5)
Monocytes Relative: 8 %
NEUTROS PCT: 49 %
Neutro Abs: 1764 cells/uL (ref 1500–7800)
Platelets: 214 10*3/uL (ref 140–400)
RBC: 4.39 MIL/uL (ref 4.20–5.80)
RDW: 12 % (ref 11.0–15.0)
WBC: 3.6 10*3/uL — AB (ref 3.8–10.8)

## 2016-09-15 LAB — COMPREHENSIVE METABOLIC PANEL
ALBUMIN: 4.3 g/dL (ref 3.6–5.1)
ALT: 9 U/L (ref 9–46)
AST: 15 U/L (ref 10–40)
Alkaline Phosphatase: 57 U/L (ref 40–115)
BILIRUBIN TOTAL: 0.6 mg/dL (ref 0.2–1.2)
BUN: 14 mg/dL (ref 7–25)
CALCIUM: 9 mg/dL (ref 8.6–10.3)
CHLORIDE: 104 mmol/L (ref 98–110)
CO2: 27 mmol/L (ref 20–31)
Creat: 0.88 mg/dL (ref 0.60–1.35)
GLUCOSE: 98 mg/dL (ref 65–99)
POTASSIUM: 3.6 mmol/L (ref 3.5–5.3)
Sodium: 141 mmol/L (ref 135–146)
Total Protein: 7.1 g/dL (ref 6.1–8.1)

## 2016-09-15 LAB — AMYLASE: Amylase: 48 U/L (ref 21–101)

## 2016-09-15 LAB — CK: CK TOTAL: 63 U/L (ref 44–196)

## 2016-09-15 LAB — HIV ANTIBODY (ROUTINE TESTING W REFLEX): HIV 1&2 Ab, 4th Generation: NONREACTIVE

## 2016-09-15 LAB — LIPASE: LIPASE: 14 U/L (ref 7–60)

## 2016-09-15 NOTE — Progress Notes (Signed)
Study: A Phase 2b/3 Double Blind Safety and Efficacy Study of Injectable Cabotegravir compared to Daily Oral Tenofovir Disoproxil Fumarate/Emtricitabine (TDF/FTC), For Pre-Exposure Prophylaxis in HIV-Uninfected Cisgender Men and Transgender Women who have sex with Men.  Medication: Investigational Injectable Cabotegravir/placebo compared to Truvada/placebo. Duration: Around 4 years.  Orvan [name] is here for week 67 visit. States mild tenderness along with small knot at injection site which lasted 2 days. States knot was approx. 2x2cm in size. Denied any bruising or redness at site. Verbalized excellent adherence with his oral study medication. Rapid HIV non-reactive. Will return in August for his next injection visit.

## 2016-09-18 ENCOUNTER — Emergency Department (HOSPITAL_COMMUNITY)
Admission: EM | Admit: 2016-09-18 | Discharge: 2016-09-18 | Disposition: A | Discharge status: Home / Self Care | Payer: BLUE CROSS/BLUE SHIELD | Attending: Emergency Medicine | Admitting: Emergency Medicine

## 2016-09-18 ENCOUNTER — Emergency Department (HOSPITAL_COMMUNITY): Payer: BLUE CROSS/BLUE SHIELD

## 2016-09-18 ENCOUNTER — Encounter (HOSPITAL_COMMUNITY): Payer: Self-pay | Admitting: Emergency Medicine

## 2016-09-18 DIAGNOSIS — R1084 Generalized abdominal pain: Secondary | ICD-10-CM | POA: Diagnosis present

## 2016-09-18 DIAGNOSIS — K529 Noninfective gastroenteritis and colitis, unspecified: Secondary | ICD-10-CM | POA: Diagnosis not present

## 2016-09-18 LAB — COMPREHENSIVE METABOLIC PANEL
ALBUMIN: 4.5 g/dL (ref 3.5–5.0)
ALT: 14 U/L — AB (ref 17–63)
ANION GAP: 9 (ref 5–15)
AST: 25 U/L (ref 15–41)
Alkaline Phosphatase: 61 U/L (ref 38–126)
BUN: 10 mg/dL (ref 6–20)
CHLORIDE: 102 mmol/L (ref 101–111)
CO2: 25 mmol/L (ref 22–32)
CREATININE: 0.96 mg/dL (ref 0.61–1.24)
Calcium: 9.4 mg/dL (ref 8.9–10.3)
GFR calc non Af Amer: >60 mL/min (ref >60)
Glucose, Bld: 92 mg/dL (ref 65–99)
Potassium: 3.4 mmol/L — ABNORMAL LOW (ref 3.5–5.1)
SODIUM: 136 mmol/L (ref 135–145)
Total Bilirubin: 1.2 mg/dL (ref 0.3–1.2)
Total Protein: 7.1 g/dL (ref 6.5–8.1)

## 2016-09-18 LAB — URINALYSIS, ROUTINE W REFLEX MICROSCOPIC
Bilirubin Urine: NEGATIVE
GLUCOSE, UA: NEGATIVE mg/dL
HGB URINE DIPSTICK: NEGATIVE
Ketones, ur: NEGATIVE mg/dL
Leukocytes, UA: NEGATIVE
Nitrite: NEGATIVE
Protein, ur: NEGATIVE mg/dL
SPECIFIC GRAVITY, URINE: 1.01 (ref 1.005–1.030)
pH: 8 (ref 5.0–8.0)

## 2016-09-18 LAB — CBC
HCT: 41 % (ref 39.0–52.0)
HEMOGLOBIN: 14.3 g/dL (ref 13.0–17.0)
MCH: 32.3 pg (ref 26.0–34.0)
MCHC: 34.9 g/dL (ref 30.0–36.0)
MCV: 92.6 fL (ref 78.0–100.0)
Platelets: 221 10*3/uL (ref 150–400)
RBC: 4.43 MIL/uL (ref 4.22–5.81)
RDW: 11.8 % (ref 11.5–15.5)
WBC: 7.2 10*3/uL (ref 4.0–10.5)

## 2016-09-18 LAB — LIPASE, BLOOD: Lipase: 25 U/L (ref 11–51)

## 2016-09-18 LAB — POC OCCULT BLOOD, ED: FECAL OCCULT BLD: POSITIVE — AB

## 2016-09-18 MED ORDER — AZITHROMYCIN 250 MG PO TABS
1000.0000 mg | ORAL_TABLET | Freq: Once | ORAL | Status: AC
Start: 2016-09-18 — End: 2016-09-18
  Administered 2016-09-18: 1000 mg via ORAL
  Filled 2016-09-18: qty 4

## 2016-09-18 MED ORDER — CEFTRIAXONE SODIUM 250 MG IJ SOLR
250.0000 mg | Freq: Once | INTRAMUSCULAR | Status: AC
  Administered 2016-09-18: 250 mg via INTRAMUSCULAR
  Filled 2016-09-18: qty 250

## 2016-09-18 MED ORDER — DICYCLOMINE HCL 20 MG PO TABS: 20.0000 mg | ORAL_TABLET | Freq: Two times a day (BID) | ORAL | 0 refills | Status: AC

## 2016-09-18 MED ORDER — CIPROFLOXACIN HCL 500 MG PO TABS: 500.0000 mg | ORAL_TABLET | Freq: Two times a day (BID) | ORAL | 0 refills | Status: AC

## 2016-09-18 MED ORDER — ONDANSETRON HCL 4 MG/2ML IJ SOLN
4.0000 mg | Freq: Once | INTRAMUSCULAR | Status: AC
Start: 2016-09-18 — End: 2016-09-18
  Administered 2016-09-18: 4 mg via INTRAVENOUS
  Filled 2016-09-18: qty 2

## 2016-09-18 MED ORDER — IOPAMIDOL (ISOVUE-300) INJECTION 61%
INTRAVENOUS | Status: AC
  Administered 2016-09-18: 100 mL
  Filled 2016-09-18: qty 100

## 2016-09-18 MED ORDER — SODIUM CHLORIDE 0.9 % IV BOLUS (SEPSIS)
1000.0000 mL | Freq: Once | INTRAVENOUS | Status: AC
  Administered 2016-09-18: 1000 mL via INTRAVENOUS

## 2016-09-18 MED ORDER — METRONIDAZOLE 500 MG PO TABS: 500.0000 mg | ORAL_TABLET | Freq: Two times a day (BID) | ORAL | 0 refills | Status: AC

## 2016-09-18 MED ORDER — TRAMADOL HCL 50 MG PO TABS: 50.0000 mg | ORAL_TABLET | Freq: Four times a day (QID) | ORAL | 0 refills | Status: AC | PRN

## 2016-09-18 MED ORDER — ONDANSETRON 4 MG PO TBDP: 4.0000 mg | ORAL_TABLET | Freq: Three times a day (TID) | ORAL | 0 refills | Status: AC | PRN

## 2016-09-18 MED ORDER — DICYCLOMINE HCL 10 MG/ML IM SOLN
20.0000 mg | Freq: Once | INTRAMUSCULAR | Status: AC
  Administered 2016-09-18: 20 mg via INTRAMUSCULAR
  Filled 2016-09-18: qty 2

## 2016-09-18 NOTE — ED Notes (Signed)
Patient transported to CT 

## 2016-09-18 NOTE — ED Provider Notes (Addendum)
Emergency Department Provider Note   I have reviewed the triage vital signs and the nursing notes.   HISTORY  Chief Complaint Abdominal Pain   HPI Roy Wheeler is a 26 y.o. male with PMH of hernia repair presents to the emergency department for evaluation of diffuse, cramping abdominal pain that started last night. The patient reports sudden onset of symptoms with no nausea or vomiting. He felt a strong urge to have a bowel movement and sat in the bathroom for proximal my 3 hours. He was not able to pass significant amounts of stool but occasionally would pass material that looked like blood. He describes it as dark red. No black, sticky substance. He had some numbness in his feet that resolved after ring off of the toilet. No chest pain or difficulty breathing. No fevers or chills. His roommate had a GI bug 1-2 weeks ago that resolved. No history of similar symptoms. No travel outside the [place]     History reviewed. No pertinent past medical history.  There are no active problems to display for this patient.   Past Surgical History:  Procedure Laterality Date  . HERNIA REPAIR      Current Outpatient Rx  . Order #: [id number] Class: Normal  . Order #: [id number] Class: Print  . Order #: [id number] Class: Print  . Order #: [id number] Class: Print  . Order #: [id number] Class: Historical Med  . Order #: [id number] Class: Print  . Order #: [id number] Class: Print  . Order #: [id number] Class: Historical Med    Allergies Patient has no known allergies.  Family History  Problem Relation Age of Onset  . Autism Sister   . Asthma Father     Social History Social History  Substance Use Topics  . Smoking status: Never Smoker  . Smokeless tobacco: Never Used  . Alcohol use Yes     Comment: rarely    Review of Systems  Constitutional: No fever/chills Eyes: No visual changes. ENT: No sore throat. Cardiovascular: Denies chest pain. Respiratory: Denies shortness of  breath. Gastrointestinal: Diffuse cramping abdominal pain.  No nausea, no vomiting. No diarrhea.  No constipation. Positive passage of blood.  Genitourinary: Negative for dysuria. Musculoskeletal: Negative for back pain. Skin: Negative for rash. Neurological: Negative for headaches, focal weakness or numbness.  10-point ROS otherwise negative.  ____________________________________________   PHYSICAL EXAM:  VITAL SIGNS: ED Triage Vitals [09/18/16 0812]  Enc Vitals Group     BP 111/71     Pulse Rate 90     Resp 19     Temp 97.8 F (36.6 C)     Temp Source Oral     SpO2 99 %     Pain Score 10   Constitutional: Alert and oriented. Well appearing and in no acute distress. Eyes: Conjunctivae are normal. Head: Atraumatic. Nose: No congestion/rhinnorhea. Mouth/Throat: Mucous membranes are moist.  Oropharynx non-erythematous. Neck: No stridor.  Cardiovascular: Normal rate, regular rhythm. Good peripheral circulation. Grossly normal heart sounds.   Respiratory: Normal respiratory effort.  No retractions. Lungs CTAB. Gastrointestinal: Soft with mild diffuse tenderness to palpation. No focal rebound or guarding. No distention. Rectal exam with no visile hemorrhoids or masses. No pain with rectal exam.  Musculoskeletal: No lower extremity tenderness nor edema. No gross deformities of extremities. Neurologic:  Normal speech and language. No gross focal neurologic deficits are appreciated.  Skin:  Skin is warm, dry and intact. No rash noted. ____________________________________________   LABS (all labs ordered are listed, but only abnormal results are displayed)  Labs  Reviewed  COMPREHENSIVE METABOLIC PANEL - Abnormal; Notable for the following:       Result Value   Potassium 3.4 (*)    ALT 14 (*)    All other components within normal limits  POC OCCULT BLOOD, ED - Abnormal; Notable for the following:    Fecal Occult Bld POSITIVE (*)    All other components within normal limits   LIPASE, BLOOD  CBC  URINALYSIS, ROUTINE W REFLEX MICROSCOPIC   ____________________________________________  RADIOLOGY  Ct Abdomen Pelvis W Contrast  Result Date: 09/18/2016 CLINICAL DATA:  Generalized abdominal pain, nausea, and vomiting for 2 days, blood in stool EXAM: CT ABDOMEN AND PELVIS WITH CONTRAST TECHNIQUE: Multidetector CT imaging of the abdomen and pelvis was performed using the standard protocol following bolus administration of intravenous contrast. Sagittal and coronal MPR images reconstructed from axial data set. CONTRAST:  ISOVUE-300 IOPAMIDOL (ISOVUE-300) INJECTION 61% IV. No oral contrast. COMPARISON:  None FINDINGS: Lower chest: Lung bases clear Hepatobiliary: Gallbladder and liver normal appearance Pancreas: Normal appearance Spleen: Normal appearance Adrenals/Urinary Tract: Adrenal glands, kidneys, ureters, and bladder normal appearance Stomach/Bowel: Suboptimal assessment of bowel in tissue planes in the pelvis due to lack of GI contrast and adequate fat planes. Marked wall thickening of the rectum and distal sigmoid colon compatible with colitis. Hyperemia of the rectum and perirectal soft tissues. Remainder of colon grossly unremarkable. Appendix normal. Stomach and small bowel loops unremarkable for technique. Vascular/Lymphatic: Vascular structures grossly patent. No definite adenopathy. Reproductive: N/A Other: Scattered perirectal and presacral edema. No free intraperitoneal air or fluid. No hernia. Musculoskeletal: Unremarkable IMPRESSION: Marked wall thickening of the rectum and distal sigmoid colon with surrounding hyperemia and soft tissue edema compatible with colitis. Differential diagnosis includes inflammatory bowel disease and infection. No definite evidence of perforation or abscess though assessment of tissue planes and bowel loops in the pelvis is suboptimal. Remainder of exam unremarkable. Electronically Signed   By: Ulyses Southward M.D.   On: 09/18/2016 12:40     ____________________________________________   PROCEDURES  Procedure(s) performed:   Procedures  None ____________________________________________   INITIAL IMPRESSION / ASSESSMENT AND PLAN / ED COURSE  Pertinent labs & imaging results that were available during my care of the patient were reviewed by me and considered in my medical decision making (see chart for details).  Patient presents to the emergency department for evaluation of diffuse abdominal cramping pain and passage of blood when attempting to have bowel movements. Blood described as small volume. No hemorrhoids, fissures, masses appreciated on exam. No gross blood on rectal exam. Labs are largely unremarkable. Plan to obtain CT scan to evaluate for underlying colitis given the patient's level of discomfort. Also given Bentyl and Zofran along with IV fluids.   12:50 PM CT scan consistent with colitis. No visualized abscess or perforation. Patient with no clinical signs or symptoms to suggest either diagnosis. Plan for treatment with Cipro/Flagyl along with supportive care. Advised patient to follow with primary care physician with possible GI referral at that time for further evaluation, possible colonoscopy, after abx.   At this time, I do not feel there is any life-threatening condition present. I have reviewed and discussed all results (EKG, imaging, lab, urine as appropriate), exam findings with patient. I have reviewed nursing notes and appropriate previous records.  I feel the patient is safe to be discharged home without further emergent workup. Discussed usual and customary return precautions. Patient and family (if present) verbalize understanding and are comfortable with this plan.  Patient will follow-up with their primary care provider. If they do not have a primary care provider, information for follow-up has been provided to them. All questions have been answered.  01:40 PM After my encounter the patient did  ask if this could occur with STDs. He reports some possible exposure which could potentially correlate with the location of his symptoms. I advised treatment with Rocephin and Azithromycin prior to D/C and patient in agreement.   ____________________________________________  FINAL CLINICAL IMPRESSION(S) / ED DIAGNOSES  Final diagnoses:  Colitis     MEDICATIONS GIVEN DURING THIS VISIT:  Medications  sodium chloride 0.9 % bolus 1,000 mL (0 mLs Intravenous Stopped 09/18/16 1139)  ondansetron (ZOFRAN) injection 4 mg (4 mg Intravenous Given 09/18/16 1017)  dicyclomine (BENTYL) injection 20 mg (20 mg Intramuscular Given 09/18/16 1017)  iopamidol (ISOVUE-300) 61 % injection (100 mLs  Contrast Given 09/18/16 1213)     NEW OUTPATIENT MEDICATIONS STARTED DURING THIS VISIT:  New Prescriptions   CIPROFLOXACIN (CIPRO) 500 MG TABLET    Take 1 tablet (500 mg total) by mouth every 12 (twelve) hours.   DICYCLOMINE (BENTYL) 20 MG TABLET    Take 1 tablet (20 mg total) by mouth 2 (two) times daily.   METRONIDAZOLE (FLAGYL) 500 MG TABLET    Take 1 tablet (500 mg total) by mouth 2 (two) times daily.   ONDANSETRON (ZOFRAN ODT) 4 MG DISINTEGRATING TABLET    Take 1 tablet (4 mg total) by mouth every 8 (eight) hours as needed for nausea or vomiting.   TRAMADOL (ULTRAM) 50 MG TABLET    Take 1 tablet (50 mg total) by mouth every 6 (six) hours as needed.      Note:  This document was prepared using Dragon voice recognition software and may include unintentional dictation errors.  Alona Bene, MD Emergency Medicine    Long, Arlyss Repress, MD 09/18/16 1312    Maia Plan, MD 09/18/16 1341

## 2016-09-18 NOTE — Discharge Instructions (Signed)
You were seen in the ED toady with colitis. We are treating you with antibiotics and pain medication. Take the complete course of antibiotics. Follow up with your PCP in the coming week as you may require referral to gastroenterology if needed.   Return to the ED with any new or worsening symptoms such as abdominal pain, fever, chills, vomiting, or worsening bleeding.

## 2016-09-18 NOTE — ED Triage Notes (Signed)
Pt sts lower abd pain starting last night; pt sts some blood noted in stool today and having chills

## 2016-11-05 ENCOUNTER — Encounter (INDEPENDENT_AMBULATORY_CARE_PROVIDER_SITE_OTHER): Payer: Self-pay | Admitting: *Deleted

## 2016-11-05 VITALS — BP 108/71 | HR 73 | Temp 98.3°F | Wt 124.8 lb

## 2016-11-05 DIAGNOSIS — Z006 Encounter for examination for normal comparison and control in clinical research program: Secondary | ICD-10-CM

## 2016-11-05 LAB — LIPASE: LIPASE: 7 U/L (ref 7–60)

## 2016-11-05 LAB — COMPREHENSIVE METABOLIC PANEL
ALBUMIN: 4.4 g/dL (ref 3.6–5.1)
ALK PHOS: 54 U/L (ref 40–115)
ALT: 8 U/L — AB (ref 9–46)
AST: 15 U/L (ref 10–40)
BUN: 11 mg/dL (ref 7–25)
CALCIUM: 8.7 mg/dL (ref 8.6–10.3)
CO2: 25 mmol/L (ref 20–32)
Chloride: 105 mmol/L (ref 98–110)
Creat: 0.9 mg/dL (ref 0.60–1.35)
Glucose, Bld: 82 mg/dL (ref 65–99)
POTASSIUM: 3.5 mmol/L (ref 3.5–5.3)
Sodium: 139 mmol/L (ref 135–146)
TOTAL PROTEIN: 6.6 g/dL (ref 6.1–8.1)
Total Bilirubin: 0.9 mg/dL (ref 0.2–1.2)

## 2016-11-05 LAB — CBC WITH DIFFERENTIAL/PLATELET
BASOS ABS: 0 {cells}/uL (ref 0–200)
Basophils Relative: 0 %
EOS ABS: 54 {cells}/uL (ref 15–500)
Eosinophils Relative: 1 %
HEMATOCRIT: 41.7 % (ref 38.5–50.0)
HEMOGLOBIN: 14 g/dL (ref 13.2–17.1)
Lymphocytes Relative: 22 %
Lymphs Abs: 1188 cells/uL (ref 850–3900)
MCH: 31.9 pg (ref 27.0–33.0)
MCHC: 33.6 g/dL (ref 32.0–36.0)
MCV: 95 fL (ref 80.0–100.0)
MONO ABS: 378 {cells}/uL (ref 200–950)
MONOS PCT: 7 %
MPV: 9.4 fL (ref 7.5–12.5)
NEUTROS ABS: 3780 {cells}/uL (ref 1500–7800)
Neutrophils Relative %: 70 %
PLATELETS: 203 10*3/uL (ref 140–400)
RBC: 4.39 MIL/uL (ref 4.20–5.80)
RDW: 12.3 % (ref 11.0–15.0)
WBC: 5.4 10*3/uL (ref 3.8–10.8)

## 2016-11-05 LAB — CK: Total CK: 77 U/L (ref 44–196)

## 2016-11-05 LAB — HIV ANTIBODY (ROUTINE TESTING W REFLEX): HIV: NONREACTIVE

## 2016-11-05 LAB — PHOSPHORUS: PHOSPHORUS: 2.7 mg/dL (ref 2.5–4.5)

## 2016-11-05 LAB — AMYLASE: Amylase: 39 U/L (ref 21–101)

## 2016-11-05 NOTE — Progress Notes (Signed)
Study: A Phase 2b/3 Double Blind Safety and Efficacy Study of Injectable Cabotegravir compared to Daily Oral Tenofovir Disoproxil Fumarate/Emtricitabine (TDF/FTC), For Pre-Exposure Prophylaxis in HIV-Uninfected Cisgender Men and Transgender Women who have sex with Men.  Medication: Investigational Injectable Cabotegravir/placebo compared to Truvada/placebo. Duration: Around 4 years.  Roy Wheeler is here for week 73. He did visit the ED for colitis in July. These symptoms have resolved. He did complete his round of abx. Oral study product was returned and #30 pills remain. He did miss a few pills thru his colitis. HIV rapid confirmed non-reactive. Questionnaires completed. Injection given in right gluteal muscle with no problem. Oral study product dispensed. He received $50 gift card for visit. Next appt scheduled for 11/12/2016.

## 2016-11-12 ENCOUNTER — Encounter (INDEPENDENT_AMBULATORY_CARE_PROVIDER_SITE_OTHER): Payer: BLUE CROSS/BLUE SHIELD | Admitting: *Deleted

## 2016-11-12 VITALS — BP 107/72 | HR 73 | Temp 97.9°F | Wt 121.2 lb

## 2016-11-12 DIAGNOSIS — Z006 Encounter for examination for normal comparison and control in clinical research program: Secondary | ICD-10-CM

## 2016-11-12 NOTE — Progress Notes (Signed)
Roy Wheeler is here for his week 75 visit for HPTN. He denies any new problems or injection site reactions. He says his adherence is good. He had an episode of colitis back in July which was treated and has completely resolved. He will be returning in October for the next visit.

## 2016-11-13 LAB — COMPREHENSIVE METABOLIC PANEL
AG Ratio: 1.8 (calc) (ref 1.0–2.5)
ALBUMIN MSPROF: 4.6 g/dL (ref 3.6–5.1)
ALKALINE PHOSPHATASE (APISO): 58 U/L (ref 40–115)
ALT: 9 U/L (ref 9–46)
AST: 15 U/L (ref 10–40)
BILIRUBIN TOTAL: 0.9 mg/dL (ref 0.2–1.2)
BUN: 14 mg/dL (ref 7–25)
CALCIUM: 9.4 mg/dL (ref 8.6–10.3)
CO2: 27 mmol/L (ref 20–32)
CREATININE: 0.96 mg/dL (ref 0.60–1.35)
Chloride: 103 mmol/L (ref 98–110)
Globulin: 2.5 g/dL (calc) (ref 1.9–3.7)
Glucose, Bld: 79 mg/dL (ref 65–99)
POTASSIUM: 3.6 mmol/L (ref 3.5–5.3)
Sodium: 140 mmol/L (ref 135–146)
Total Protein: 7.1 g/dL (ref 6.1–8.1)

## 2016-11-13 LAB — CBC WITH DIFFERENTIAL/PLATELET
BASOS PCT: 0.4 %
Basophils Absolute: 21 cells/uL (ref 0–200)
EOS PCT: 0.6 %
Eosinophils Absolute: 31 cells/uL (ref 15–500)
HEMATOCRIT: 41.6 % (ref 38.5–50.0)
Hemoglobin: 14.1 g/dL (ref 13.2–17.1)
LYMPHS ABS: 1170 {cells}/uL (ref 850–3900)
MCH: 31.6 pg (ref 27.0–33.0)
MCHC: 33.9 g/dL (ref 32.0–36.0)
MCV: 93.3 fL (ref 80.0–100.0)
MPV: 9.8 fL (ref 7.5–12.5)
Monocytes Relative: 8.1 %
Neutro Abs: 3557 cells/uL (ref 1500–7800)
Neutrophils Relative %: 68.4 %
PLATELETS: 199 10*3/uL (ref 140–400)
RBC: 4.46 10*6/uL (ref 4.20–5.80)
RDW: 11.2 % (ref 11.0–15.0)
Total Lymphocyte: 22.5 %
WBC: 5.2 10*3/uL (ref 3.8–10.8)
WBCMIX: 421 {cells}/uL (ref 200–950)

## 2016-11-13 LAB — LIPASE: Lipase: 7 U/L (ref 7–60)

## 2016-11-13 LAB — HIV ANTIBODY (ROUTINE TESTING W REFLEX): HIV 1&2 Ab, 4th Generation: NONREACTIVE

## 2016-11-13 LAB — AMYLASE: AMYLASE: 35 U/L (ref 21–101)

## 2016-11-13 LAB — PHOSPHORUS: PHOSPHORUS: 2.9 mg/dL (ref 2.5–4.5)

## 2016-11-13 LAB — CK: CK TOTAL: 119 U/L (ref 44–196)

## 2016-12-24 ENCOUNTER — Encounter (INDEPENDENT_AMBULATORY_CARE_PROVIDER_SITE_OTHER): Payer: Self-pay | Admitting: *Deleted

## 2016-12-24 VITALS — BP 102/70 | HR 67 | Temp 98.0°F | Wt 127.2 lb

## 2016-12-24 DIAGNOSIS — Z006 Encounter for examination for normal comparison and control in clinical research program: Secondary | ICD-10-CM

## 2016-12-24 NOTE — Progress Notes (Signed)
Roy Wheeler was here for week 75 for HPTN 083. He denies any new problems or medications. He was given an injection of study drug in his rt butt. His adherence is close to 100% with the truvada/placebo. He did say he had one encounter for oral sex that he did not feel comfortable about because he felt like the guy was lying to him. He denies any symptoms of acute infection. He will be returning in 2 weeks for the next visit.

## 2016-12-24 NOTE — Progress Notes (Signed)
S/w ppt in Kim's office regarding a social event for people on the study. Ppt would be interested in dinner, bowling, a game night or a movie if approved.

## 2016-12-25 LAB — LIPASE: Lipase: 12 U/L (ref 7–60)

## 2016-12-25 LAB — CBC WITH DIFFERENTIAL/PLATELET
Basophils Absolute: 18 cells/uL (ref 0–200)
Basophils Relative: 0.5 %
EOS PCT: 0.8 %
Eosinophils Absolute: 29 cells/uL (ref 15–500)
HEMATOCRIT: 42.9 % (ref 38.5–50.0)
HEMOGLOBIN: 14.4 g/dL (ref 13.2–17.1)
LYMPHS ABS: 1296 {cells}/uL (ref 850–3900)
MCH: 31.7 pg (ref 27.0–33.0)
MCHC: 33.6 g/dL (ref 32.0–36.0)
MCV: 94.5 fL (ref 80.0–100.0)
MPV: 10 fL (ref 7.5–12.5)
Monocytes Relative: 7.4 %
NEUTROS ABS: 1991 {cells}/uL (ref 1500–7800)
NEUTROS PCT: 55.3 %
Platelets: 190 10*3/uL (ref 140–400)
RBC: 4.54 10*6/uL (ref 4.20–5.80)
RDW: 11.2 % (ref 11.0–15.0)
Total Lymphocyte: 36 %
WBC mixed population: 266 cells/uL (ref 200–950)
WBC: 3.6 10*3/uL — AB (ref 3.8–10.8)

## 2016-12-25 LAB — COMPREHENSIVE METABOLIC PANEL
AG Ratio: 1.7 (calc) (ref 1.0–2.5)
ALKALINE PHOSPHATASE (APISO): 53 U/L (ref 40–115)
ALT: 7 U/L — AB (ref 9–46)
AST: 14 U/L (ref 10–40)
Albumin: 4.5 g/dL (ref 3.6–5.1)
BILIRUBIN TOTAL: 0.8 mg/dL (ref 0.2–1.2)
BUN: 15 mg/dL (ref 7–25)
CALCIUM: 9.1 mg/dL (ref 8.6–10.3)
CO2: 29 mmol/L (ref 20–32)
Chloride: 101 mmol/L (ref 98–110)
Creat: 0.93 mg/dL (ref 0.60–1.35)
Globulin: 2.7 g/dL (calc) (ref 1.9–3.7)
Glucose, Bld: 86 mg/dL (ref 65–99)
Potassium: 3.9 mmol/L (ref 3.5–5.3)
Sodium: 138 mmol/L (ref 135–146)
Total Protein: 7.2 g/dL (ref 6.1–8.1)

## 2016-12-25 LAB — RPR: RPR: NONREACTIVE

## 2016-12-25 LAB — AMYLASE: AMYLASE: 47 U/L (ref 21–101)

## 2016-12-25 LAB — C. TRACHOMATIS/N. GONORRHOEAE RNA
C. trachomatis RNA, TMA: NOT DETECTED
N. GONORRHOEAE RNA, TMA: NOT DETECTED

## 2016-12-25 LAB — CK: CK TOTAL: 78 U/L (ref 44–196)

## 2016-12-25 LAB — PHOSPHORUS: Phosphorus: 3.5 mg/dL (ref 2.5–4.5)

## 2016-12-25 LAB — HIV ANTIBODY (ROUTINE TESTING W REFLEX): HIV 1&2 Ab, 4th Generation: NONREACTIVE

## 2016-12-26 LAB — CT/NG RNA, TMA RECTAL

## 2017-01-08 ENCOUNTER — Encounter (INDEPENDENT_AMBULATORY_CARE_PROVIDER_SITE_OTHER): Payer: BLUE CROSS/BLUE SHIELD | Admitting: *Deleted

## 2017-01-08 VITALS — BP 106/70 | HR 72 | Temp 98.0°F | Wt 125.8 lb

## 2017-01-08 DIAGNOSIS — Z006 Encounter for examination for normal comparison and control in clinical research program: Secondary | ICD-10-CM

## 2017-01-08 NOTE — Progress Notes (Signed)
Study: A Phase 2b/3 Double Blind Safety and Efficacy Study of Injectable Cabotegravir compared to Daily Oral Tenofovir Disoproxil Fumarate/Emtricitabine (TDF/FTC), For Pre-Exposure Prophylaxis in HIV-Uninfected Cisgender Men and Transgender Women who have sex with Men.  Medication: Investigational Injectable Cabotegravir/placebo compared to Truvada/placebo. Duration: Around 4 years.  Roy Wheeler is here for week 83. Roy Wheeler denies any complaints or changes. Roy Wheeler experienced no injection site reaction. HIV rapid confirmed non-reactive. Questionnaire completed. Will see him in December for next injection.

## 2017-01-09 LAB — COMPREHENSIVE METABOLIC PANEL
AG Ratio: 1.8 (calc) (ref 1.0–2.5)
ALBUMIN MSPROF: 4.4 g/dL (ref 3.6–5.1)
ALKALINE PHOSPHATASE (APISO): 64 U/L (ref 40–115)
ALT: 9 U/L (ref 9–46)
AST: 14 U/L (ref 10–40)
BUN: 16 mg/dL (ref 7–25)
CHLORIDE: 103 mmol/L (ref 98–110)
CO2: 31 mmol/L (ref 20–32)
CREATININE: 0.98 mg/dL (ref 0.60–1.35)
Calcium: 9.3 mg/dL (ref 8.6–10.3)
GLOBULIN: 2.5 g/dL (ref 1.9–3.7)
Glucose, Bld: 56 mg/dL — ABNORMAL LOW (ref 65–99)
POTASSIUM: 3.7 mmol/L (ref 3.5–5.3)
SODIUM: 143 mmol/L (ref 135–146)
TOTAL PROTEIN: 6.9 g/dL (ref 6.1–8.1)
Total Bilirubin: 0.6 mg/dL (ref 0.2–1.2)

## 2017-01-09 LAB — CBC WITH DIFFERENTIAL/PLATELET
BASOS PCT: 0.6 %
Basophils Absolute: 21 cells/uL (ref 0–200)
EOS ABS: 39 {cells}/uL (ref 15–500)
Eosinophils Relative: 1.1 %
HCT: 40 % (ref 38.5–50.0)
HEMOGLOBIN: 14 g/dL (ref 13.2–17.1)
Lymphs Abs: 1148 cells/uL (ref 850–3900)
MCH: 32.9 pg (ref 27.0–33.0)
MCHC: 35 g/dL (ref 32.0–36.0)
MCV: 94.1 fL (ref 80.0–100.0)
MONOS PCT: 7.2 %
MPV: 9.8 fL (ref 7.5–12.5)
NEUTROS ABS: 2041 {cells}/uL (ref 1500–7800)
Neutrophils Relative %: 58.3 %
Platelets: 201 10*3/uL (ref 140–400)
RBC: 4.25 10*6/uL (ref 4.20–5.80)
RDW: 11.1 % (ref 11.0–15.0)
Total Lymphocyte: 32.8 %
WBC: 3.5 10*3/uL — ABNORMAL LOW (ref 3.8–10.8)
WBCMIX: 252 {cells}/uL (ref 200–950)

## 2017-01-09 LAB — HIV ANTIBODY (ROUTINE TESTING W REFLEX): HIV 1&2 Ab, 4th Generation: NONREACTIVE

## 2017-01-09 LAB — CK: CK TOTAL: 93 U/L (ref 44–196)

## 2017-01-09 LAB — LIPASE: LIPASE: 14 U/L (ref 7–60)

## 2017-01-09 LAB — PHOSPHORUS: Phosphorus: 3.1 mg/dL (ref 2.5–4.5)

## 2017-01-09 LAB — AMYLASE: Amylase: 39 U/L (ref 21–101)

## 2017-01-13 LAB — CT/NG RNA, TMA RECTAL
CHLAMYDIA TRACHOMATIS RNA: NOT DETECTED
NEISSERIA GONORRHOEAE RNA: NOT DETECTED

## 2017-02-17 ENCOUNTER — Encounter (INDEPENDENT_AMBULATORY_CARE_PROVIDER_SITE_OTHER): Payer: Self-pay | Admitting: *Deleted

## 2017-02-17 VITALS — BP 122/77 | HR 81 | Temp 97.6°F | Wt 132.2 lb

## 2017-02-17 DIAGNOSIS — Z006 Encounter for examination for normal comparison and control in clinical research program: Secondary | ICD-10-CM

## 2017-02-17 NOTE — Progress Notes (Signed)
Study: A Phase 2b/3 Double Blind Safety and Efficacy Study of Injectable Cabotegravir compared to Daily Oral Tenofovir Disoproxil Fumarate/Emtricitabine (TDF/FTC), For Pre-Exposure Prophylaxis in HIV-Uninfected Cisgender Men and Transgender Women who have sex with Men.  Medication: Investigational Injectable Cabotegravir/placebo compared to Truvada/placebo. Duration: Around 4 years.  Roy Wheeler is here for week 89 visit. No new complaints or concerns. His adherence is 100% with his oral truvada/placebo study medication. Rapid HIV non-reactive. Cabotegravir/placebo injection given (R) glute. Site unremarkable. He will return next week for safety visit.

## 2017-02-18 LAB — CBC WITH DIFFERENTIAL/PLATELET
BASOS ABS: 9 {cells}/uL (ref 0–200)
Basophils Relative: 0.3 %
EOS ABS: 50 {cells}/uL (ref 15–500)
EOS PCT: 1.6 %
HEMATOCRIT: 44.2 % (ref 38.5–50.0)
Hemoglobin: 15.1 g/dL (ref 13.2–17.1)
LYMPHS ABS: 1144 {cells}/uL (ref 850–3900)
MCH: 32.2 pg (ref 27.0–33.0)
MCHC: 34.2 g/dL (ref 32.0–36.0)
MCV: 94.2 fL (ref 80.0–100.0)
MPV: 9.6 fL (ref 7.5–12.5)
Monocytes Relative: 8 %
Neutro Abs: 1649 cells/uL (ref 1500–7800)
Neutrophils Relative %: 53.2 %
Platelets: 193 10*3/uL (ref 140–400)
RBC: 4.69 10*6/uL (ref 4.20–5.80)
RDW: 11.1 % (ref 11.0–15.0)
Total Lymphocyte: 36.9 %
WBC: 3.1 10*3/uL — ABNORMAL LOW (ref 3.8–10.8)
WBCMIX: 248 {cells}/uL (ref 200–950)

## 2017-02-18 LAB — COMPREHENSIVE METABOLIC PANEL
AG RATIO: 1.8 (calc) (ref 1.0–2.5)
ALKALINE PHOSPHATASE (APISO): 54 U/L (ref 40–115)
ALT: 14 U/L (ref 9–46)
AST: 19 U/L (ref 10–40)
Albumin: 4.6 g/dL (ref 3.6–5.1)
BILIRUBIN TOTAL: 0.7 mg/dL (ref 0.2–1.2)
BUN: 17 mg/dL (ref 7–25)
CALCIUM: 9.4 mg/dL (ref 8.6–10.3)
CO2: 31 mmol/L (ref 20–32)
Chloride: 103 mmol/L (ref 98–110)
Creat: 0.96 mg/dL (ref 0.60–1.35)
Globulin: 2.5 g/dL (calc) (ref 1.9–3.7)
Glucose, Bld: 95 mg/dL (ref 65–99)
Potassium: 3.9 mmol/L (ref 3.5–5.3)
SODIUM: 140 mmol/L (ref 135–146)
TOTAL PROTEIN: 7.1 g/dL (ref 6.1–8.1)

## 2017-02-18 LAB — LIPASE: LIPASE: 10 U/L (ref 7–60)

## 2017-02-18 LAB — AMYLASE: AMYLASE: 53 U/L (ref 21–101)

## 2017-02-18 LAB — PHOSPHORUS: PHOSPHORUS: 3.5 mg/dL (ref 2.5–4.5)

## 2017-02-18 LAB — CK: Total CK: 122 U/L (ref 44–196)

## 2017-02-18 LAB — HIV ANTIBODY (ROUTINE TESTING W REFLEX): HIV: NONREACTIVE

## 2017-02-26 ENCOUNTER — Encounter (INDEPENDENT_AMBULATORY_CARE_PROVIDER_SITE_OTHER): Payer: Self-pay | Admitting: *Deleted

## 2017-02-26 VITALS — BP 118/75 | HR 96 | Temp 98.0°F | Wt 131.0 lb

## 2017-02-26 DIAGNOSIS — Z006 Encounter for examination for normal comparison and control in clinical research program: Secondary | ICD-10-CM

## 2017-02-26 NOTE — Progress Notes (Signed)
Study: A Phase 2b/3 Double Blind Safety and Efficacy Study of Injectable Cabotegravir compared to Daily Oral Tenofovir Disoproxil Fumarate/Emtricitabine (TDF/FTC), For Pre-Exposure Prophylaxis in HIV-Uninfected Cisgender Men and Transgender Women who have sex with Men.  Medication: Investigational Injectable Cabotegravir/placebo compared to Truvada/placebo. Duration: Around 4 years.  Roy Wheeler is here for week 91 visit. Denies any injection site reaction after his last injection. No new complaints or concerns. Rapid HIV non-reactive. Verbalized excellent adherence with oral study medication. He will return in February for his next study visit.

## 2017-02-27 LAB — CBC WITH DIFFERENTIAL/PLATELET
BASOS ABS: 20 {cells}/uL (ref 0–200)
Basophils Relative: 0.6 %
EOS ABS: 51 {cells}/uL (ref 15–500)
Eosinophils Relative: 1.5 %
HCT: 42.7 % (ref 38.5–50.0)
HEMOGLOBIN: 14.9 g/dL (ref 13.2–17.1)
Lymphs Abs: 1170 cells/uL (ref 850–3900)
MCH: 32.6 pg (ref 27.0–33.0)
MCHC: 34.9 g/dL (ref 32.0–36.0)
MCV: 93.4 fL (ref 80.0–100.0)
MONOS PCT: 7.9 %
MPV: 9.6 fL (ref 7.5–12.5)
NEUTROS ABS: 1890 {cells}/uL (ref 1500–7800)
Neutrophils Relative %: 55.6 %
Platelets: 206 10*3/uL (ref 140–400)
RBC: 4.57 10*6/uL (ref 4.20–5.80)
RDW: 11.2 % (ref 11.0–15.0)
Total Lymphocyte: 34.4 %
WBC: 3.4 10*3/uL — ABNORMAL LOW (ref 3.8–10.8)
WBCMIX: 269 {cells}/uL (ref 200–950)

## 2017-02-27 LAB — HIV ANTIBODY (ROUTINE TESTING W REFLEX): HIV 1&2 Ab, 4th Generation: NONREACTIVE

## 2017-02-27 LAB — COMPREHENSIVE METABOLIC PANEL
AG RATIO: 1.7 (calc) (ref 1.0–2.5)
ALKALINE PHOSPHATASE (APISO): 56 U/L (ref 40–115)
ALT: 11 U/L (ref 9–46)
AST: 17 U/L (ref 10–40)
Albumin: 4.5 g/dL (ref 3.6–5.1)
BILIRUBIN TOTAL: 0.7 mg/dL (ref 0.2–1.2)
BUN: 15 mg/dL (ref 7–25)
CALCIUM: 9.3 mg/dL (ref 8.6–10.3)
CO2: 31 mmol/L (ref 20–32)
Chloride: 103 mmol/L (ref 98–110)
Creat: 0.93 mg/dL (ref 0.60–1.35)
Globulin: 2.6 g/dL (calc) (ref 1.9–3.7)
Glucose, Bld: 78 mg/dL (ref 65–99)
Potassium: 4 mmol/L (ref 3.5–5.3)
Sodium: 140 mmol/L (ref 135–146)
TOTAL PROTEIN: 7.1 g/dL (ref 6.1–8.1)

## 2017-02-27 LAB — AMYLASE: AMYLASE: 45 U/L (ref 21–101)

## 2017-02-27 LAB — CK: CK TOTAL: 79 U/L (ref 44–196)

## 2017-02-27 LAB — PHOSPHORUS: PHOSPHORUS: 3.1 mg/dL (ref 2.5–4.5)

## 2017-02-27 LAB — LIPASE: LIPASE: 9 U/L (ref 7–60)

## 2017-04-20 ENCOUNTER — Encounter (INDEPENDENT_AMBULATORY_CARE_PROVIDER_SITE_OTHER): Payer: BLUE CROSS/BLUE SHIELD | Admitting: *Deleted

## 2017-04-20 VITALS — BP 110/73 | HR 84 | Temp 98.0°F | Wt 133.5 lb

## 2017-04-20 DIAGNOSIS — Z006 Encounter for examination for normal comparison and control in clinical research program: Secondary | ICD-10-CM

## 2017-04-20 NOTE — Progress Notes (Signed)
Roy Wheeler is here for his week 97 visit [facility] He received an injection of study medication in his rt butt. Without problem. For the past week and a half he has been having nasal congestion and taking mucinex D and robitussin prn. He denies any other problems. His adherence has been excellent . He will be returning in 2 weeks for the next study visit.

## 2017-04-21 LAB — CK: Total CK: 149 U/L (ref 44–196)

## 2017-04-21 LAB — COMPREHENSIVE METABOLIC PANEL
AG RATIO: 1.8 (calc) (ref 1.0–2.5)
ALT: 12 U/L (ref 9–46)
AST: 19 U/L (ref 10–40)
Albumin: 4.5 g/dL (ref 3.6–5.1)
Alkaline phosphatase (APISO): 58 U/L (ref 40–115)
BUN: 18 mg/dL (ref 7–25)
CHLORIDE: 104 mmol/L (ref 98–110)
CO2: 30 mmol/L (ref 20–32)
Calcium: 9.5 mg/dL (ref 8.6–10.3)
Creat: 1.01 mg/dL (ref 0.60–1.35)
GLOBULIN: 2.5 g/dL (ref 1.9–3.7)
GLUCOSE: 103 mg/dL — AB (ref 65–99)
Potassium: 3.7 mmol/L (ref 3.5–5.3)
SODIUM: 139 mmol/L (ref 135–146)
TOTAL PROTEIN: 7 g/dL (ref 6.1–8.1)
Total Bilirubin: 0.6 mg/dL (ref 0.2–1.2)

## 2017-04-21 LAB — CBC WITH DIFFERENTIAL/PLATELET
BASOS PCT: 0.3 %
Basophils Absolute: 18 cells/uL (ref 0–200)
EOS ABS: 71 {cells}/uL (ref 15–500)
Eosinophils Relative: 1.2 %
HEMATOCRIT: 41.7 % (ref 38.5–50.0)
HEMOGLOBIN: 14.5 g/dL (ref 13.2–17.1)
LYMPHS ABS: 1269 {cells}/uL (ref 850–3900)
MCH: 32.2 pg (ref 27.0–33.0)
MCHC: 34.8 g/dL (ref 32.0–36.0)
MCV: 92.7 fL (ref 80.0–100.0)
MONOS PCT: 6.8 %
MPV: 9.8 fL (ref 7.5–12.5)
NEUTROS ABS: 4142 {cells}/uL (ref 1500–7800)
Neutrophils Relative %: 70.2 %
Platelets: 207 10*3/uL (ref 140–400)
RBC: 4.5 10*6/uL (ref 4.20–5.80)
RDW: 11.3 % (ref 11.0–15.0)
Total Lymphocyte: 21.5 %
WBC: 5.9 10*3/uL (ref 3.8–10.8)
WBCMIX: 401 {cells}/uL (ref 200–950)

## 2017-04-21 LAB — LIPASE: Lipase: 13 U/L (ref 7–60)

## 2017-04-21 LAB — AMYLASE: Amylase: 41 U/L (ref 21–101)

## 2017-04-21 LAB — PHOSPHORUS: Phosphorus: 3.1 mg/dL (ref 2.5–4.5)

## 2017-04-21 LAB — HIV ANTIBODY (ROUTINE TESTING W REFLEX): HIV 1&2 Ab, 4th Generation: NONREACTIVE

## 2017-05-04 ENCOUNTER — Encounter: Payer: BLUE CROSS/BLUE SHIELD | Admitting: *Deleted

## 2017-05-06 ENCOUNTER — Encounter (INDEPENDENT_AMBULATORY_CARE_PROVIDER_SITE_OTHER): Payer: Self-pay | Admitting: *Deleted

## 2017-05-06 VITALS — BP 105/69 | HR 61 | Temp 97.9°F | Wt 136.5 lb

## 2017-05-06 DIAGNOSIS — Z006 Encounter for examination for normal comparison and control in clinical research program: Secondary | ICD-10-CM

## 2017-05-06 NOTE — Progress Notes (Signed)
Study: A Phase 2b/3 Double Blind Safety and Efficacy Study of Injectable Cabotegravir compared to Daily Oral Tenofovir Disoproxil Fumarate/Emtricitabine (TDF/FTC), For Pre-Exposure Prophylaxis in HIV-Uninfected Cisgender Men and Transgender Women who have sex with Men.  Medication: Investigational Injectable Cabotegravir/placebo compared to Truvada/placebo. Duration: Around 4 years.  Orvan [name] is here for week 99 visit. Denies any injection site reaction. No new problems or medications. Verbalized good adherence with his oral study medication. Rapid HIV non-reactive. He will return in April for his next study visit.

## 2017-05-07 LAB — CBC WITH DIFFERENTIAL/PLATELET
BASOS PCT: 0.7 %
Basophils Absolute: 21 cells/uL (ref 0–200)
EOS ABS: 51 {cells}/uL (ref 15–500)
Eosinophils Relative: 1.7 %
HCT: 40.5 % (ref 38.5–50.0)
Hemoglobin: 13.9 g/dL (ref 13.2–17.1)
LYMPHS ABS: 1113 {cells}/uL (ref 850–3900)
MCH: 32.3 pg (ref 27.0–33.0)
MCHC: 34.3 g/dL (ref 32.0–36.0)
MCV: 94.2 fL (ref 80.0–100.0)
MPV: 9.7 fL (ref 7.5–12.5)
Monocytes Relative: 7.4 %
NEUTROS PCT: 53.1 %
Neutro Abs: 1593 cells/uL (ref 1500–7800)
PLATELETS: 202 10*3/uL (ref 140–400)
RBC: 4.3 10*6/uL (ref 4.20–5.80)
RDW: 11.2 % (ref 11.0–15.0)
TOTAL LYMPHOCYTE: 37.1 %
WBC: 3 10*3/uL — ABNORMAL LOW (ref 3.8–10.8)
WBCMIX: 222 {cells}/uL (ref 200–950)

## 2017-05-07 LAB — PHOSPHORUS: PHOSPHORUS: 2.9 mg/dL (ref 2.5–4.5)

## 2017-05-07 LAB — COMPREHENSIVE METABOLIC PANEL
AG RATIO: 1.8 (calc) (ref 1.0–2.5)
ALKALINE PHOSPHATASE (APISO): 54 U/L (ref 40–115)
ALT: 11 U/L (ref 9–46)
AST: 18 U/L (ref 10–40)
Albumin: 4.3 g/dL (ref 3.6–5.1)
BUN: 16 mg/dL (ref 7–25)
CALCIUM: 9.3 mg/dL (ref 8.6–10.3)
CHLORIDE: 105 mmol/L (ref 98–110)
CO2: 30 mmol/L (ref 20–32)
Creat: 0.94 mg/dL (ref 0.60–1.35)
GLOBULIN: 2.4 g/dL (ref 1.9–3.7)
Glucose, Bld: 89 mg/dL (ref 65–99)
Potassium: 4.4 mmol/L (ref 3.5–5.3)
SODIUM: 140 mmol/L (ref 135–146)
TOTAL PROTEIN: 6.7 g/dL (ref 6.1–8.1)
Total Bilirubin: 0.4 mg/dL (ref 0.2–1.2)

## 2017-05-07 LAB — AMYLASE: AMYLASE: 53 U/L (ref 21–101)

## 2017-05-07 LAB — CK: CK TOTAL: 321 U/L — AB (ref 44–196)

## 2017-05-07 LAB — LIPASE: LIPASE: 12 U/L (ref 7–60)

## 2017-05-07 LAB — HIV ANTIBODY (ROUTINE TESTING W REFLEX): HIV 1&2 Ab, 4th Generation: NONREACTIVE

## 2017-06-17 ENCOUNTER — Encounter (INDEPENDENT_AMBULATORY_CARE_PROVIDER_SITE_OTHER): Payer: Self-pay | Admitting: *Deleted

## 2017-06-17 VITALS — BP 108/73 | HR 66 | Temp 98.0°F | Wt 138.0 lb

## 2017-06-17 DIAGNOSIS — Z006 Encounter for examination for normal comparison and control in clinical research program: Secondary | ICD-10-CM

## 2017-06-17 NOTE — Progress Notes (Signed)
Study: A Phase 2b/3 Double Blind Safety and Efficacy Study of Injectable Cabotegravir compared to Daily Oral Tenofovir Disoproxil Fumarate/Emtricitabine (TDF/FTC), For Pre-Exposure Prophylaxis in HIV-Uninfected Cisgender Men and Transgender Women who have sex with Men.  Medication: Investigational Injectable Cabotegravir/placebo compared to Truvada/placebo. Duration: Around 4 years.  Orvan [name] is here for week 105 visit. No new complaints or concerns. His adherence with oral study medication has been excellent. Rapid HIV non-reactive. Received injection of study medication (R) buttock. Site unremarkable. He will return next week for safety visit.

## 2017-06-18 LAB — COMPREHENSIVE METABOLIC PANEL
AG RATIO: 2.1 (calc) (ref 1.0–2.5)
ALBUMIN MSPROF: 4.8 g/dL (ref 3.6–5.1)
ALKALINE PHOSPHATASE (APISO): 58 U/L (ref 40–115)
ALT: 10 U/L (ref 9–46)
AST: 18 U/L (ref 10–40)
BILIRUBIN TOTAL: 0.8 mg/dL (ref 0.2–1.2)
BUN: 11 mg/dL (ref 7–25)
CALCIUM: 9.3 mg/dL (ref 8.6–10.3)
CHLORIDE: 100 mmol/L (ref 98–110)
CO2: 29 mmol/L (ref 20–32)
Creat: 0.87 mg/dL (ref 0.60–1.35)
GLOBULIN: 2.3 g/dL (ref 1.9–3.7)
Glucose, Bld: 86 mg/dL (ref 65–99)
POTASSIUM: 3.7 mmol/L (ref 3.5–5.3)
SODIUM: 139 mmol/L (ref 135–146)
TOTAL PROTEIN: 7.1 g/dL (ref 6.1–8.1)

## 2017-06-18 LAB — LIPID PANEL
CHOLESTEROL: 178 mg/dL (ref <200)
HDL: 46 mg/dL (ref >40)
LDL Cholesterol (Calc): 118 mg/dL (calc) — ABNORMAL HIGH
NON-HDL CHOLESTEROL (CALC): 132 mg/dL — AB (ref <130)
Total CHOL/HDL Ratio: 3.9 (calc) (ref <5.0)
Triglycerides: 58 mg/dL (ref <150)

## 2017-06-18 LAB — LIPASE: Lipase: 7 U/L (ref 7–60)

## 2017-06-18 LAB — CBC WITH DIFFERENTIAL/PLATELET
BASOS PCT: 0.3 %
Basophils Absolute: 12 cells/uL (ref 0–200)
EOS PCT: 1.6 %
Eosinophils Absolute: 62 cells/uL (ref 15–500)
HEMATOCRIT: 43.6 % (ref 38.5–50.0)
HEMOGLOBIN: 15 g/dL (ref 13.2–17.1)
LYMPHS ABS: 1595 {cells}/uL (ref 850–3900)
MCH: 32.2 pg (ref 27.0–33.0)
MCHC: 34.4 g/dL (ref 32.0–36.0)
MCV: 93.6 fL (ref 80.0–100.0)
MPV: 9.7 fL (ref 7.5–12.5)
Monocytes Relative: 7.3 %
NEUTROS ABS: 1946 {cells}/uL (ref 1500–7800)
NEUTROS PCT: 49.9 %
Platelets: 211 10*3/uL (ref 140–400)
RBC: 4.66 10*6/uL (ref 4.20–5.80)
RDW: 11.2 % (ref 11.0–15.0)
Total Lymphocyte: 40.9 %
WBC mixed population: 285 cells/uL (ref 200–950)
WBC: 3.9 10*3/uL (ref 3.8–10.8)

## 2017-06-18 LAB — URINALYSIS, ROUTINE W REFLEX MICROSCOPIC
BACTERIA UA: NONE SEEN /HPF
Bilirubin Urine: NEGATIVE
Glucose, UA: NEGATIVE
HGB URINE DIPSTICK: NEGATIVE
Hyaline Cast: NONE SEEN /LPF
KETONES UR: NEGATIVE
Nitrite: NEGATIVE
PH: 6.5 (ref 5.0–8.0)
PROTEIN: NEGATIVE
RBC / HPF: NONE SEEN /HPF (ref 0–2)
Specific Gravity, Urine: 1.011 (ref 1.001–1.03)

## 2017-06-18 LAB — HIV ANTIBODY (ROUTINE TESTING W REFLEX): HIV: NONREACTIVE

## 2017-06-18 LAB — RPR: RPR Ser Ql: NONREACTIVE

## 2017-06-18 LAB — CK: CK TOTAL: 87 U/L (ref 44–196)

## 2017-06-18 LAB — C. TRACHOMATIS/N. GONORRHOEAE RNA
C. trachomatis RNA, TMA: NOT DETECTED
N. gonorrhoeae RNA, TMA: NOT DETECTED

## 2017-06-18 LAB — HEPATITIS C ANTIBODY
HEP C AB: NONREACTIVE
SIGNAL TO CUT-OFF: 0.01 (ref <1.00)

## 2017-06-18 LAB — AMYLASE: AMYLASE: 49 U/L (ref 21–101)

## 2017-06-18 LAB — PHOSPHORUS: Phosphorus: 3.3 mg/dL (ref 2.5–4.5)

## 2017-06-19 LAB — CT/NG RNA, TMA RECTAL
CHLAMYDIA TRACHOMATIS RNA: NOT DETECTED
Neisseria Gonorrhoeae RNA: NOT DETECTED

## 2017-06-25 ENCOUNTER — Encounter (INDEPENDENT_AMBULATORY_CARE_PROVIDER_SITE_OTHER): Payer: Self-pay | Admitting: *Deleted

## 2017-06-25 VITALS — BP 115/79 | HR 79 | Temp 97.8°F | Wt 137.5 lb

## 2017-06-25 DIAGNOSIS — Z006 Encounter for examination for normal comparison and control in clinical research program: Secondary | ICD-10-CM

## 2017-06-25 NOTE — Progress Notes (Signed)
Roy Wheeler Roy Wheeler is here for his week 107 HPTN 083 visit. Denied any problems with his last injection. No new medications or concerns. Rapid HIV non-reactive. Next study visit scheduled for 08/04/17.

## 2017-06-26 LAB — CBC WITH DIFFERENTIAL/PLATELET
BASOS PCT: 0.2 %
Basophils Absolute: 8 cells/uL (ref 0–200)
EOS ABS: 50 {cells}/uL (ref 15–500)
EOS PCT: 1.2 %
HCT: 41.7 % (ref 38.5–50.0)
HEMOGLOBIN: 14.8 g/dL (ref 13.2–17.1)
Lymphs Abs: 1004 cells/uL (ref 850–3900)
MCH: 33.1 pg — ABNORMAL HIGH (ref 27.0–33.0)
MCHC: 35.5 g/dL (ref 32.0–36.0)
MCV: 93.3 fL (ref 80.0–100.0)
MONOS PCT: 6.2 %
MPV: 9.9 fL (ref 7.5–12.5)
NEUTROS ABS: 2877 {cells}/uL (ref 1500–7800)
Neutrophils Relative %: 68.5 %
PLATELETS: 187 10*3/uL (ref 140–400)
RBC: 4.47 10*6/uL (ref 4.20–5.80)
RDW: 11.1 % (ref 11.0–15.0)
Total Lymphocyte: 23.9 %
WBC mixed population: 260 cells/uL (ref 200–950)
WBC: 4.2 10*3/uL (ref 3.8–10.8)

## 2017-06-26 LAB — LIPASE: Lipase: 10 U/L (ref 7–60)

## 2017-06-26 LAB — HIV ANTIBODY (ROUTINE TESTING W REFLEX): HIV: NONREACTIVE

## 2017-06-26 LAB — COMPREHENSIVE METABOLIC PANEL
AG RATIO: 2 (calc) (ref 1.0–2.5)
ALBUMIN MSPROF: 4.7 g/dL (ref 3.6–5.1)
ALT: 11 U/L (ref 9–46)
AST: 18 U/L (ref 10–40)
Alkaline phosphatase (APISO): 60 U/L (ref 40–115)
BUN: 19 mg/dL (ref 7–25)
CHLORIDE: 103 mmol/L (ref 98–110)
CO2: 30 mmol/L (ref 20–32)
Calcium: 9.4 mg/dL (ref 8.6–10.3)
Creat: 0.86 mg/dL (ref 0.60–1.35)
GLOBULIN: 2.3 g/dL (ref 1.9–3.7)
GLUCOSE: 93 mg/dL (ref 65–99)
Potassium: 3.8 mmol/L (ref 3.5–5.3)
SODIUM: 139 mmol/L (ref 135–146)
TOTAL PROTEIN: 7 g/dL (ref 6.1–8.1)
Total Bilirubin: 0.6 mg/dL (ref 0.2–1.2)

## 2017-06-26 LAB — CK: CK TOTAL: 87 U/L (ref 44–196)

## 2017-06-26 LAB — AMYLASE: Amylase: 51 U/L (ref 21–101)

## 2017-06-26 LAB — PHOSPHORUS: PHOSPHORUS: 3.8 mg/dL (ref 2.5–4.5)

## 2017-08-05 ENCOUNTER — Encounter (INDEPENDENT_AMBULATORY_CARE_PROVIDER_SITE_OTHER): Payer: Self-pay | Admitting: *Deleted

## 2017-08-05 VITALS — BP 107/73 | HR 74 | Temp 97.9°F | Wt 132.0 lb

## 2017-08-05 DIAGNOSIS — Z006 Encounter for examination for normal comparison and control in clinical research program: Secondary | ICD-10-CM

## 2017-08-05 NOTE — Progress Notes (Signed)
Roy Wheeler is here for his week 113 HPTN 083 visit. No new complaints or medication. He did not bring his oral study medication today but states good adherence. Feels that he only missed 1 or 2 doses since his last injection visit. 1 unprotected sexual encounter. Denied any symptoms of acute HIV. Rapid HIV non-reactive. Cabotegravir/placebo injection given (R) glute. Site unremarkable. Next visit scheduled for 08/12/17.

## 2017-08-06 LAB — CBC WITH DIFFERENTIAL/PLATELET
BASOS ABS: 9 {cells}/uL (ref 0–200)
Basophils Relative: 0.3 %
EOS ABS: 69 {cells}/uL (ref 15–500)
Eosinophils Relative: 2.3 %
HEMATOCRIT: 42.7 % (ref 38.5–50.0)
HEMOGLOBIN: 14.7 g/dL (ref 13.2–17.1)
LYMPHS ABS: 1197 {cells}/uL (ref 850–3900)
MCH: 31.9 pg (ref 27.0–33.0)
MCHC: 34.4 g/dL (ref 32.0–36.0)
MCV: 92.6 fL (ref 80.0–100.0)
MPV: 9.8 fL (ref 7.5–12.5)
Monocytes Relative: 8.4 %
NEUTROS ABS: 1473 {cells}/uL — AB (ref 1500–7800)
NEUTROS PCT: 49.1 %
Platelets: 195 10*3/uL (ref 140–400)
RBC: 4.61 10*6/uL (ref 4.20–5.80)
RDW: 11 % (ref 11.0–15.0)
Total Lymphocyte: 39.9 %
WBC: 3 10*3/uL — ABNORMAL LOW (ref 3.8–10.8)
WBCMIX: 252 {cells}/uL (ref 200–950)

## 2017-08-06 LAB — COMPREHENSIVE METABOLIC PANEL
AG Ratio: 1.9 (calc) (ref 1.0–2.5)
ALBUMIN MSPROF: 4.5 g/dL (ref 3.6–5.1)
ALT: 11 U/L (ref 9–46)
AST: 17 U/L (ref 10–40)
Alkaline phosphatase (APISO): 55 U/L (ref 40–115)
BILIRUBIN TOTAL: 0.6 mg/dL (ref 0.2–1.2)
BUN: 19 mg/dL (ref 7–25)
CALCIUM: 9.2 mg/dL (ref 8.6–10.3)
CO2: 29 mmol/L (ref 20–32)
CREATININE: 0.99 mg/dL (ref 0.60–1.35)
Chloride: 104 mmol/L (ref 98–110)
Globulin: 2.4 g/dL (calc) (ref 1.9–3.7)
Glucose, Bld: 91 mg/dL (ref 65–99)
POTASSIUM: 3.8 mmol/L (ref 3.5–5.3)
SODIUM: 139 mmol/L (ref 135–146)
TOTAL PROTEIN: 6.9 g/dL (ref 6.1–8.1)

## 2017-08-06 LAB — PHOSPHORUS: Phosphorus: 3.2 mg/dL (ref 2.5–4.5)

## 2017-08-06 LAB — LIPASE: Lipase: 7 U/L (ref 7–60)

## 2017-08-06 LAB — HIV ANTIBODY (ROUTINE TESTING W REFLEX): HIV: NONREACTIVE

## 2017-08-06 LAB — CK: CK TOTAL: 94 U/L (ref 44–196)

## 2017-08-06 LAB — AMYLASE: Amylase: 41 U/L (ref 21–101)

## 2017-08-12 ENCOUNTER — Encounter (INDEPENDENT_AMBULATORY_CARE_PROVIDER_SITE_OTHER): Payer: Self-pay | Admitting: *Deleted

## 2017-08-12 VITALS — BP 107/70 | HR 84 | Temp 97.9°F | Wt 134.5 lb

## 2017-08-12 DIAGNOSIS — Z006 Encounter for examination for normal comparison and control in clinical research program: Secondary | ICD-10-CM

## 2017-08-12 NOTE — Progress Notes (Signed)
Roy Wheeler is here for his week 115 visit. Denied any injection site reaction. No new problems or medications. Rapid HIV non-reactive. Verbalized good adherence with his oral study medication. He will return in July for his next study visit.

## 2017-08-13 LAB — HIV ANTIBODY (ROUTINE TESTING W REFLEX): HIV 1&2 Ab, 4th Generation: NONREACTIVE

## 2017-08-13 LAB — CBC WITH DIFFERENTIAL/PLATELET
BASOS PCT: 0.5 %
Basophils Absolute: 21 cells/uL (ref 0–200)
EOS ABS: 70 {cells}/uL (ref 15–500)
Eosinophils Relative: 1.7 %
HCT: 43.4 % (ref 38.5–50.0)
HEMOGLOBIN: 14.8 g/dL (ref 13.2–17.1)
Lymphs Abs: 1353 cells/uL (ref 850–3900)
MCH: 31.6 pg (ref 27.0–33.0)
MCHC: 34.1 g/dL (ref 32.0–36.0)
MCV: 92.7 fL (ref 80.0–100.0)
MONOS PCT: 7 %
MPV: 9.7 fL (ref 7.5–12.5)
Neutro Abs: 2370 cells/uL (ref 1500–7800)
Neutrophils Relative %: 57.8 %
PLATELETS: 199 10*3/uL (ref 140–400)
RBC: 4.68 10*6/uL (ref 4.20–5.80)
RDW: 11.1 % (ref 11.0–15.0)
TOTAL LYMPHOCYTE: 33 %
WBC mixed population: 287 cells/uL (ref 200–950)
WBC: 4.1 10*3/uL (ref 3.8–10.8)

## 2017-08-13 LAB — COMPREHENSIVE METABOLIC PANEL
AG Ratio: 1.7 (calc) (ref 1.0–2.5)
ALBUMIN MSPROF: 4.3 g/dL (ref 3.6–5.1)
ALT: 8 U/L — AB (ref 9–46)
AST: 14 U/L (ref 10–40)
Alkaline phosphatase (APISO): 58 U/L (ref 40–115)
BUN: 17 mg/dL (ref 7–25)
CO2: 29 mmol/L (ref 20–32)
Calcium: 9.3 mg/dL (ref 8.6–10.3)
Chloride: 103 mmol/L (ref 98–110)
Creat: 0.95 mg/dL (ref 0.60–1.35)
GLUCOSE: 98 mg/dL (ref 65–99)
Globulin: 2.6 g/dL (calc) (ref 1.9–3.7)
POTASSIUM: 4 mmol/L (ref 3.5–5.3)
SODIUM: 138 mmol/L (ref 135–146)
TOTAL PROTEIN: 6.9 g/dL (ref 6.1–8.1)
Total Bilirubin: 0.8 mg/dL (ref 0.2–1.2)

## 2017-08-13 LAB — AMYLASE: Amylase: 50 U/L (ref 21–101)

## 2017-08-13 LAB — CK: CK TOTAL: 61 U/L (ref 44–196)

## 2017-08-13 LAB — PHOSPHORUS: Phosphorus: 3.4 mg/dL (ref 2.5–4.5)

## 2017-08-13 LAB — LIPASE: Lipase: 9 U/L (ref 7–60)

## 2017-10-05 ENCOUNTER — Encounter (INDEPENDENT_AMBULATORY_CARE_PROVIDER_SITE_OTHER): Payer: Self-pay

## 2017-10-05 VITALS — BP 95/66 | HR 60 | Temp 97.9°F | Wt 133.2 lb

## 2017-10-05 DIAGNOSIS — Z006 Encounter for examination for normal comparison and control in clinical research program: Secondary | ICD-10-CM

## 2017-10-05 NOTE — Progress Notes (Signed)
Participant here for [id number] study. Voices no complaints. Vital signs within normal limits. Rapid HIV non-reactive. Received new dispense of oral IP and injection in right glute at 1100 without complications. Participant scheduled for next study visit on 8/12.

## 2017-10-06 LAB — CBC WITH DIFFERENTIAL/PLATELET
BASOS PCT: 0.5 %
Basophils Absolute: 22 cells/uL (ref 0–200)
EOS ABS: 31 {cells}/uL (ref 15–500)
Eosinophils Relative: 0.7 %
HCT: 42.4 % (ref 38.5–50.0)
Hemoglobin: 14.6 g/dL (ref 13.2–17.1)
Lymphs Abs: 1742 cells/uL (ref 850–3900)
MCH: 32.4 pg (ref 27.0–33.0)
MCHC: 34.4 g/dL (ref 32.0–36.0)
MCV: 94.2 fL (ref 80.0–100.0)
MONOS PCT: 9.1 %
MPV: 9.9 fL (ref 7.5–12.5)
Neutro Abs: 2204 cells/uL (ref 1500–7800)
Neutrophils Relative %: 50.1 %
PLATELETS: 190 10*3/uL (ref 140–400)
RBC: 4.5 10*6/uL (ref 4.20–5.80)
RDW: 11.1 % (ref 11.0–15.0)
TOTAL LYMPHOCYTE: 39.6 %
WBC mixed population: 400 cells/uL (ref 200–950)
WBC: 4.4 10*3/uL (ref 3.8–10.8)

## 2017-10-06 LAB — COMPREHENSIVE METABOLIC PANEL
AG RATIO: 1.7 (calc) (ref 1.0–2.5)
ALBUMIN MSPROF: 4.5 g/dL (ref 3.6–5.1)
ALKALINE PHOSPHATASE (APISO): 66 U/L (ref 40–115)
ALT: 8 U/L — ABNORMAL LOW (ref 9–46)
AST: 15 U/L (ref 10–40)
BUN: 20 mg/dL (ref 7–25)
CHLORIDE: 103 mmol/L (ref 98–110)
CO2: 29 mmol/L (ref 20–32)
Calcium: 9.3 mg/dL (ref 8.6–10.3)
Creat: 0.98 mg/dL (ref 0.60–1.35)
Globulin: 2.6 g/dL (calc) (ref 1.9–3.7)
Glucose, Bld: 87 mg/dL (ref 65–99)
POTASSIUM: 3.8 mmol/L (ref 3.5–5.3)
SODIUM: 139 mmol/L (ref 135–146)
TOTAL PROTEIN: 7.1 g/dL (ref 6.1–8.1)
Total Bilirubin: 0.6 mg/dL (ref 0.2–1.2)

## 2017-10-06 LAB — PHOSPHORUS: Phosphorus: 4 mg/dL (ref 2.5–4.5)

## 2017-10-06 LAB — HIV ANTIBODY (ROUTINE TESTING W REFLEX): HIV 1&2 Ab, 4th Generation: NONREACTIVE

## 2017-10-06 LAB — CK: Total CK: 70 U/L (ref 44–196)

## 2017-10-06 LAB — LIPASE: LIPASE: 13 U/L (ref 7–60)

## 2017-10-06 LAB — AMYLASE: Amylase: 52 U/L (ref 21–101)

## 2017-10-19 ENCOUNTER — Encounter (INDEPENDENT_AMBULATORY_CARE_PROVIDER_SITE_OTHER): Payer: Self-pay

## 2017-10-19 VITALS — BP 103/68 | HR 87 | Temp 98.0°F | Wt 133.2 lb

## 2017-10-19 DIAGNOSIS — Z006 Encounter for examination for normal comparison and control in clinical research program: Secondary | ICD-10-CM

## 2017-10-20 LAB — COMPREHENSIVE METABOLIC PANEL
AG RATIO: 1.8 (calc) (ref 1.0–2.5)
ALKALINE PHOSPHATASE (APISO): 59 U/L (ref 40–115)
ALT: 7 U/L — AB (ref 9–46)
AST: 14 U/L (ref 10–40)
Albumin: 4.3 g/dL (ref 3.6–5.1)
BILIRUBIN TOTAL: 0.6 mg/dL (ref 0.2–1.2)
BUN: 12 mg/dL (ref 7–25)
CALCIUM: 9.1 mg/dL (ref 8.6–10.3)
CO2: 28 mmol/L (ref 20–32)
Chloride: 107 mmol/L (ref 98–110)
Creat: 0.94 mg/dL (ref 0.60–1.35)
Globulin: 2.4 g/dL (calc) (ref 1.9–3.7)
Glucose, Bld: 91 mg/dL (ref 65–99)
Potassium: 3.6 mmol/L (ref 3.5–5.3)
SODIUM: 142 mmol/L (ref 135–146)
Total Protein: 6.7 g/dL (ref 6.1–8.1)

## 2017-10-20 LAB — CBC WITH DIFFERENTIAL/PLATELET
BASOS ABS: 20 {cells}/uL (ref 0–200)
Basophils Relative: 0.4 %
EOS PCT: 1.2 %
Eosinophils Absolute: 59 cells/uL (ref 15–500)
HCT: 39.5 % (ref 38.5–50.0)
Hemoglobin: 13.6 g/dL (ref 13.2–17.1)
Lymphs Abs: 1534 cells/uL (ref 850–3900)
MCH: 32.5 pg (ref 27.0–33.0)
MCHC: 34.4 g/dL (ref 32.0–36.0)
MCV: 94.3 fL (ref 80.0–100.0)
MONOS PCT: 5.8 %
MPV: 9.8 fL (ref 7.5–12.5)
NEUTROS PCT: 61.3 %
Neutro Abs: 3004 cells/uL (ref 1500–7800)
PLATELETS: 220 10*3/uL (ref 140–400)
RBC: 4.19 10*6/uL — AB (ref 4.20–5.80)
RDW: 11.2 % (ref 11.0–15.0)
Total Lymphocyte: 31.3 %
WBC mixed population: 284 cells/uL (ref 200–950)
WBC: 4.9 10*3/uL (ref 3.8–10.8)

## 2017-10-20 LAB — CK: Total CK: 80 U/L (ref 44–196)

## 2017-10-20 LAB — RPR: RPR: NONREACTIVE

## 2017-10-20 LAB — HIV ANTIBODY (ROUTINE TESTING W REFLEX): HIV: NONREACTIVE

## 2017-10-20 LAB — LIPASE: LIPASE: 12 U/L (ref 7–60)

## 2017-10-20 LAB — AMYLASE: AMYLASE: 43 U/L (ref 21–101)

## 2017-10-20 LAB — PHOSPHORUS: Phosphorus: 3.3 mg/dL (ref 2.5–4.5)

## 2017-10-20 NOTE — Progress Notes (Signed)
Participant here for [id number] study visit. Participant voices that he was contacted by [facility] as being exposed to syphilis. RPR was drawn today in addition to regular study labs. Participant states he would like to be contacted via phone with the results. Otherwise participant is well and voices no concerns. He is scheduled for next study visit on 9/19.

## 2017-11-26 ENCOUNTER — Encounter (INDEPENDENT_AMBULATORY_CARE_PROVIDER_SITE_OTHER): Payer: Self-pay

## 2017-11-26 VITALS — BP 113/73 | HR 80 | Temp 98.2°F | Wt 135.5 lb

## 2017-11-26 DIAGNOSIS — Z006 Encounter for examination for normal comparison and control in clinical research program: Secondary | ICD-10-CM

## 2017-11-26 NOTE — Research (Signed)
Participant here for [id number] week 129. No new complaints voiced other than sinus congestion for which he has started mucinex. Rapid HIV was nonreactive. Participant received IM injection of cabotegravir/placebo in right glute and new dispense of oral IP. He is scheduled for follow up on 9/30.

## 2017-11-27 LAB — LIPASE: Lipase: 12 U/L (ref 7–60)

## 2017-11-27 LAB — CBC WITH DIFFERENTIAL/PLATELET
BASOS ABS: 31 {cells}/uL (ref 0–200)
Basophils Relative: 0.6 %
EOS ABS: 31 {cells}/uL (ref 15–500)
Eosinophils Relative: 0.6 %
HEMATOCRIT: 41.6 % (ref 38.5–50.0)
Hemoglobin: 14.2 g/dL (ref 13.2–17.1)
LYMPHS ABS: 1637 {cells}/uL (ref 850–3900)
MCH: 31.5 pg (ref 27.0–33.0)
MCHC: 34.1 g/dL (ref 32.0–36.0)
MCV: 92.2 fL (ref 80.0–100.0)
MPV: 10 fL (ref 7.5–12.5)
Monocytes Relative: 4.3 %
Neutro Abs: 3182 cells/uL (ref 1500–7800)
Neutrophils Relative %: 62.4 %
Platelets: 203 10*3/uL (ref 140–400)
RBC: 4.51 10*6/uL (ref 4.20–5.80)
RDW: 11.1 % (ref 11.0–15.0)
Total Lymphocyte: 32.1 %
WBC: 5.1 10*3/uL (ref 3.8–10.8)
WBCMIX: 219 {cells}/uL (ref 200–950)

## 2017-11-27 LAB — COMPREHENSIVE METABOLIC PANEL
AG Ratio: 1.7 (calc) (ref 1.0–2.5)
ALBUMIN MSPROF: 4.5 g/dL (ref 3.6–5.1)
ALT: 10 U/L (ref 9–46)
AST: 19 U/L (ref 10–40)
Alkaline phosphatase (APISO): 61 U/L (ref 40–115)
BUN: 20 mg/dL (ref 7–25)
CO2: 29 mmol/L (ref 20–32)
Calcium: 9.2 mg/dL (ref 8.6–10.3)
Chloride: 102 mmol/L (ref 98–110)
Creat: 1.13 mg/dL (ref 0.60–1.35)
GLOBULIN: 2.7 g/dL (ref 1.9–3.7)
GLUCOSE: 102 mg/dL — AB (ref 65–99)
POTASSIUM: 3.9 mmol/L (ref 3.5–5.3)
SODIUM: 139 mmol/L (ref 135–146)
Total Bilirubin: 0.5 mg/dL (ref 0.2–1.2)
Total Protein: 7.2 g/dL (ref 6.1–8.1)

## 2017-11-27 LAB — C. TRACHOMATIS/N. GONORRHOEAE RNA
C. trachomatis RNA, TMA: NOT DETECTED
N. gonorrhoeae RNA, TMA: NOT DETECTED

## 2017-11-27 LAB — CK: CK TOTAL: 73 U/L (ref 44–196)

## 2017-11-27 LAB — PHOSPHORUS: Phosphorus: 3.8 mg/dL (ref 2.5–4.5)

## 2017-11-27 LAB — RPR: RPR Ser Ql: NONREACTIVE

## 2017-11-27 LAB — AMYLASE: Amylase: 47 U/L (ref 21–101)

## 2017-11-27 LAB — HIV ANTIBODY (ROUTINE TESTING W REFLEX): HIV 1&2 Ab, 4th Generation: NONREACTIVE

## 2017-11-28 LAB — CT/NG RNA, TMA RECTAL
CHLAMYDIA TRACHOMATIS RNA: NOT DETECTED
Neisseria Gonorrhoeae RNA: NOT DETECTED

## 2017-12-07 ENCOUNTER — Encounter (INDEPENDENT_AMBULATORY_CARE_PROVIDER_SITE_OTHER): Payer: Self-pay | Admitting: *Deleted

## 2017-12-07 VITALS — BP 117/77 | HR 76 | Temp 98.1°F | Wt 134.2 lb

## 2017-12-07 DIAGNOSIS — Z006 Encounter for examination for normal comparison and control in clinical research program: Secondary | ICD-10-CM

## 2017-12-07 NOTE — Research (Signed)
Roy Wheeler was here for his week 131 visit for [facility] 083. He said he developed a samll knot on his butt. After the last injection which lasted a few days bit was not painful. He says his adherence is good. He will be returning in October for the next study visit.

## 2017-12-08 LAB — COMPREHENSIVE METABOLIC PANEL
AG Ratio: 1.6 (calc) (ref 1.0–2.5)
ALKALINE PHOSPHATASE (APISO): 60 U/L (ref 40–115)
ALT: 11 U/L (ref 9–46)
AST: 19 U/L (ref 10–40)
Albumin: 4.4 g/dL (ref 3.6–5.1)
BUN: 18 mg/dL (ref 7–25)
CALCIUM: 9.3 mg/dL (ref 8.6–10.3)
CO2: 29 mmol/L (ref 20–32)
CREATININE: 0.95 mg/dL (ref 0.60–1.35)
Chloride: 103 mmol/L (ref 98–110)
GLUCOSE: 88 mg/dL (ref 65–99)
Globulin: 2.7 g/dL (calc) (ref 1.9–3.7)
Potassium: 3.6 mmol/L (ref 3.5–5.3)
Sodium: 141 mmol/L (ref 135–146)
Total Bilirubin: 0.6 mg/dL (ref 0.2–1.2)
Total Protein: 7.1 g/dL (ref 6.1–8.1)

## 2017-12-08 LAB — CBC WITH DIFFERENTIAL/PLATELET
Basophils Absolute: 8 cells/uL (ref 0–200)
Basophils Relative: 0.2 %
EOS ABS: 49 {cells}/uL (ref 15–500)
Eosinophils Relative: 1.2 %
HCT: 41.5 % (ref 38.5–50.0)
Hemoglobin: 14.4 g/dL (ref 13.2–17.1)
Lymphs Abs: 1435 cells/uL (ref 850–3900)
MCH: 31.9 pg (ref 27.0–33.0)
MCHC: 34.7 g/dL (ref 32.0–36.0)
MCV: 92 fL (ref 80.0–100.0)
MONOS PCT: 8 %
MPV: 9.8 fL (ref 7.5–12.5)
NEUTROS PCT: 55.6 %
Neutro Abs: 2280 cells/uL (ref 1500–7800)
PLATELETS: 228 10*3/uL (ref 140–400)
RBC: 4.51 10*6/uL (ref 4.20–5.80)
RDW: 11.3 % (ref 11.0–15.0)
TOTAL LYMPHOCYTE: 35 %
WBC: 4.1 10*3/uL (ref 3.8–10.8)
WBCMIX: 328 {cells}/uL (ref 200–950)

## 2017-12-08 LAB — LIPASE: Lipase: 11 U/L (ref 7–60)

## 2017-12-08 LAB — AMYLASE: Amylase: 44 U/L (ref 21–101)

## 2017-12-08 LAB — HIV ANTIBODY (ROUTINE TESTING W REFLEX): HIV 1&2 Ab, 4th Generation: NONREACTIVE

## 2017-12-08 LAB — PHOSPHORUS: PHOSPHORUS: 3.5 mg/dL (ref 2.5–4.5)

## 2017-12-08 LAB — CK: CK TOTAL: 113 U/L (ref 44–196)

## 2018-01-25 ENCOUNTER — Encounter (INDEPENDENT_AMBULATORY_CARE_PROVIDER_SITE_OTHER): Payer: Self-pay | Admitting: *Deleted

## 2018-01-25 VITALS — BP 122/73 | HR 90 | Temp 98.3°F | Wt 140.2 lb

## 2018-01-25 DIAGNOSIS — Z006 Encounter for examination for normal comparison and control in clinical research program: Secondary | ICD-10-CM

## 2018-01-25 MED ORDER — STUDY - HPTN 083 - CABOTEGRAVIR 600MG/3ML INJECTION OR PLACEBO (PI-VAN DAM): 600.0000 mg | INJECTION | INTRAMUSCULAR | 0 refills | Status: DC

## 2018-01-25 MED ORDER — STUDY - HPTN 083 - EMTRICITABINE/TENOFOVIR DISOPROXIL FUMARATE 200-300MG (TRUVADA) OR PLACEBO TABLET (PI-VAN DAM): 1.0000 | ORAL_TABLET | Freq: Every day | ORAL | Status: DC

## 2018-01-25 NOTE — Research (Signed)
Roy Wheeler is here for his week 137 HPTN 083 visit. Adherence 100% with his oral study medication. No new complaints or medications. Rapid HIV non-reactive. Cabotegravir/placebo injection given (R) glute without problem. He will return in 2 weeks for his next study visit.

## 2018-01-26 LAB — COMPLETE METABOLIC PANEL WITH GFR
AG Ratio: 1.8 (calc) (ref 1.0–2.5)
ALKALINE PHOSPHATASE (APISO): 53 U/L (ref 40–115)
ALT: 11 U/L (ref 9–46)
AST: 19 U/L (ref 10–40)
Albumin: 4.3 g/dL (ref 3.6–5.1)
BILIRUBIN TOTAL: 0.5 mg/dL (ref 0.2–1.2)
BUN: 16 mg/dL (ref 7–25)
CHLORIDE: 103 mmol/L (ref 98–110)
CO2: 30 mmol/L (ref 20–32)
Calcium: 9.2 mg/dL (ref 8.6–10.3)
Creat: 1.01 mg/dL (ref 0.60–1.35)
GFR, EST AFRICAN AMERICAN: 116 mL/min/{1.73_m2} (ref >=60)
GFR, Est Non African American: 100 mL/min/{1.73_m2} (ref >=60)
Globulin: 2.4 g/dL (calc) (ref 1.9–3.7)
Glucose, Bld: 86 mg/dL (ref 65–99)
Potassium: 3.9 mmol/L (ref 3.5–5.3)
Sodium: 141 mmol/L (ref 135–146)
TOTAL PROTEIN: 6.7 g/dL (ref 6.1–8.1)

## 2018-01-26 LAB — CBC WITH DIFFERENTIAL/PLATELET
BASOS PCT: 0.2 %
Basophils Absolute: 9 cells/uL (ref 0–200)
Eosinophils Absolute: 28 cells/uL (ref 15–500)
Eosinophils Relative: 0.6 %
HCT: 41.5 % (ref 38.5–50.0)
HEMOGLOBIN: 14.2 g/dL (ref 13.2–17.1)
Lymphs Abs: 1532 cells/uL (ref 850–3900)
MCH: 32.1 pg (ref 27.0–33.0)
MCHC: 34.2 g/dL (ref 32.0–36.0)
MCV: 93.7 fL (ref 80.0–100.0)
MPV: 10.1 fL (ref 7.5–12.5)
Monocytes Relative: 7 %
NEUTROS ABS: 2801 {cells}/uL (ref 1500–7800)
Neutrophils Relative %: 59.6 %
PLATELETS: 196 10*3/uL (ref 140–400)
RBC: 4.43 10*6/uL (ref 4.20–5.80)
RDW: 10.9 % — ABNORMAL LOW (ref 11.0–15.0)
TOTAL LYMPHOCYTE: 32.6 %
WBC: 4.7 10*3/uL (ref 3.8–10.8)
WBCMIX: 329 {cells}/uL (ref 200–950)

## 2018-01-26 LAB — HIV ANTIBODY (ROUTINE TESTING W REFLEX): HIV: NONREACTIVE

## 2018-01-26 LAB — LIPASE: LIPASE: 10 U/L (ref 7–60)

## 2018-01-26 LAB — PHOSPHORUS: Phosphorus: 3 mg/dL (ref 2.5–4.5)

## 2018-01-26 LAB — CK: CK TOTAL: 114 U/L (ref 44–196)

## 2018-01-26 LAB — AMYLASE: AMYLASE: 46 U/L (ref 21–101)

## 2018-02-08 ENCOUNTER — Encounter (INDEPENDENT_AMBULATORY_CARE_PROVIDER_SITE_OTHER): Payer: Self-pay | Admitting: *Deleted

## 2018-02-08 VITALS — BP 103/69 | HR 87 | Temp 97.8°F | Wt 138.8 lb

## 2018-02-08 DIAGNOSIS — Z006 Encounter for examination for normal comparison and control in clinical research program: Secondary | ICD-10-CM

## 2018-02-08 NOTE — Research (Signed)
Roy Wheeler is here for his week 139 HPTN 083 visit. He did have mild tenderness at the injection site which lasted 3 days. Verbalized good adherence with his medication. Rapid HIV non-reactive today. He will return in January for his next study visit.

## 2018-02-09 LAB — CBC WITH DIFFERENTIAL/PLATELET
BASOS ABS: 20 {cells}/uL (ref 0–200)
Basophils Relative: 0.4 %
Eosinophils Absolute: 30 cells/uL (ref 15–500)
Eosinophils Relative: 0.6 %
HEMATOCRIT: 42.6 % (ref 38.5–50.0)
Hemoglobin: 15 g/dL (ref 13.2–17.1)
Lymphs Abs: 1415 cells/uL (ref 850–3900)
MCH: 32.5 pg (ref 27.0–33.0)
MCHC: 35.2 g/dL (ref 32.0–36.0)
MCV: 92.2 fL (ref 80.0–100.0)
MPV: 9.8 fL (ref 7.5–12.5)
Monocytes Relative: 7.4 %
Neutro Abs: 3165 cells/uL (ref 1500–7800)
Neutrophils Relative %: 63.3 %
Platelets: 259 10*3/uL (ref 140–400)
RBC: 4.62 10*6/uL (ref 4.20–5.80)
RDW: 11.1 % (ref 11.0–15.0)
Total Lymphocyte: 28.3 %
WBC: 5 10*3/uL (ref 3.8–10.8)
WBCMIX: 370 {cells}/uL (ref 200–950)

## 2018-02-09 LAB — COMPREHENSIVE METABOLIC PANEL
AG Ratio: 1.7 (calc) (ref 1.0–2.5)
ALBUMIN MSPROF: 4.5 g/dL (ref 3.6–5.1)
ALKALINE PHOSPHATASE (APISO): 60 U/L (ref 40–115)
ALT: 11 U/L (ref 9–46)
AST: 18 U/L (ref 10–40)
BILIRUBIN TOTAL: 0.5 mg/dL (ref 0.2–1.2)
BUN: 16 mg/dL (ref 7–25)
CALCIUM: 9.4 mg/dL (ref 8.6–10.3)
CO2: 29 mmol/L (ref 20–32)
CREATININE: 1 mg/dL (ref 0.60–1.35)
Chloride: 103 mmol/L (ref 98–110)
Globulin: 2.6 g/dL (calc) (ref 1.9–3.7)
Glucose, Bld: 100 mg/dL — ABNORMAL HIGH (ref 65–99)
Potassium: 3.9 mmol/L (ref 3.5–5.3)
Sodium: 139 mmol/L (ref 135–146)
TOTAL PROTEIN: 7.1 g/dL (ref 6.1–8.1)

## 2018-02-09 LAB — AMYLASE: AMYLASE: 47 U/L (ref 21–101)

## 2018-02-09 LAB — HIV ANTIBODY (ROUTINE TESTING W REFLEX): HIV: NONREACTIVE

## 2018-02-09 LAB — PHOSPHORUS: PHOSPHORUS: 3.2 mg/dL (ref 2.5–4.5)

## 2018-02-09 LAB — CK: CK TOTAL: 85 U/L (ref 44–196)

## 2018-02-09 LAB — LIPASE: Lipase: 10 U/L (ref 7–60)

## 2018-03-22 ENCOUNTER — Encounter (INDEPENDENT_AMBULATORY_CARE_PROVIDER_SITE_OTHER): Payer: BLUE CROSS/BLUE SHIELD

## 2018-03-22 VITALS — BP 98/64 | HR 86 | Temp 98.1°F | Wt 139.8 lb

## 2018-03-22 DIAGNOSIS — Z006 Encounter for examination for normal comparison and control in clinical research program: Secondary | ICD-10-CM

## 2018-03-22 NOTE — Research (Signed)
Participant here for [id number] study. He has no new complaints or concerns. He has been doing well with adherence and has only missed one dose of IP per calculation. Today's rapid HIV non-reactive. He received a new dispense of oral IP and IM injection of cabotegravir/placebo in the right buttocks without issue. He is scheduled for a return visit on 03/29/18.

## 2018-03-23 LAB — COMPREHENSIVE METABOLIC PANEL
AG Ratio: 2 (calc) (ref 1.0–2.5)
ALT: 11 U/L (ref 9–46)
AST: 18 U/L (ref 10–40)
Albumin: 4.3 g/dL (ref 3.6–5.1)
Alkaline phosphatase (APISO): 54 U/L (ref 40–115)
BUN: 14 mg/dL (ref 7–25)
CO2: 27 mmol/L (ref 20–32)
Calcium: 9 mg/dL (ref 8.6–10.3)
Chloride: 106 mmol/L (ref 98–110)
Creat: 0.91 mg/dL (ref 0.60–1.35)
Globulin: 2.1 g/dL (calc) (ref 1.9–3.7)
Glucose, Bld: 106 mg/dL — ABNORMAL HIGH (ref 65–99)
Potassium: 3.7 mmol/L (ref 3.5–5.3)
Sodium: 140 mmol/L (ref 135–146)
Total Bilirubin: 0.5 mg/dL (ref 0.2–1.2)
Total Protein: 6.4 g/dL (ref 6.1–8.1)

## 2018-03-23 LAB — CBC WITH DIFFERENTIAL/PLATELET
Absolute Monocytes: 360 cells/uL (ref 200–950)
Basophils Absolute: 10 cells/uL (ref 0–200)
Basophils Relative: 0.2 %
Eosinophils Absolute: 58 cells/uL (ref 15–500)
Eosinophils Relative: 1.2 %
HCT: 39.5 % (ref 38.5–50.0)
Hemoglobin: 13.7 g/dL (ref 13.2–17.1)
Lymphs Abs: 1550 cells/uL (ref 850–3900)
MCH: 32.2 pg (ref 27.0–33.0)
MCHC: 34.7 g/dL (ref 32.0–36.0)
MCV: 92.9 fL (ref 80.0–100.0)
MPV: 10.4 fL (ref 7.5–12.5)
Monocytes Relative: 7.5 %
Neutro Abs: 2822 cells/uL (ref 1500–7800)
Neutrophils Relative %: 58.8 %
Platelets: 223 10*3/uL (ref 140–400)
RBC: 4.25 10*6/uL (ref 4.20–5.80)
RDW: 11.4 % (ref 11.0–15.0)
Total Lymphocyte: 32.3 %
WBC: 4.8 10*3/uL (ref 3.8–10.8)

## 2018-03-23 LAB — LIPASE: Lipase: 9 U/L (ref 7–60)

## 2018-03-23 LAB — HIV ANTIBODY (ROUTINE TESTING W REFLEX): HIV: NONREACTIVE

## 2018-03-23 LAB — CK: Total CK: 131 U/L (ref 44–196)

## 2018-03-23 LAB — PHOSPHORUS: Phosphorus: 3.4 mg/dL (ref 2.5–4.5)

## 2018-03-23 LAB — AMYLASE: Amylase: 46 U/L (ref 21–101)

## 2018-03-29 ENCOUNTER — Encounter (INDEPENDENT_AMBULATORY_CARE_PROVIDER_SITE_OTHER): Payer: BLUE CROSS/BLUE SHIELD

## 2018-03-29 VITALS — BP 106/74 | HR 76 | Temp 98.0°F | Wt 136.5 lb

## 2018-03-29 DIAGNOSIS — Z006 Encounter for examination for normal comparison and control in clinical research program: Secondary | ICD-10-CM

## 2018-03-29 NOTE — Research (Signed)
Participant here for [id number] study. No new concerns. He voices no issues with his last injection. He feels he is still taking his medication daily. Today's rapid HIV non-reactive. He is scheduled for his next study follow up on 05/10/18.

## 2018-03-30 LAB — CBC WITH DIFFERENTIAL/PLATELET
Absolute Monocytes: 260 cells/uL (ref 200–950)
BASOS ABS: 31 {cells}/uL (ref 0–200)
Basophils Relative: 0.7 %
EOS ABS: 40 {cells}/uL (ref 15–500)
Eosinophils Relative: 0.9 %
HCT: 43.7 % (ref 38.5–50.0)
Hemoglobin: 14.8 g/dL (ref 13.2–17.1)
Lymphs Abs: 1478 cells/uL (ref 850–3900)
MCH: 31.8 pg (ref 27.0–33.0)
MCHC: 33.9 g/dL (ref 32.0–36.0)
MCV: 94 fL (ref 80.0–100.0)
MPV: 9.7 fL (ref 7.5–12.5)
Monocytes Relative: 5.9 %
NEUTROS PCT: 58.9 %
Neutro Abs: 2592 cells/uL (ref 1500–7800)
Platelets: 239 10*3/uL (ref 140–400)
RBC: 4.65 10*6/uL (ref 4.20–5.80)
RDW: 11.1 % (ref 11.0–15.0)
Total Lymphocyte: 33.6 %
WBC: 4.4 10*3/uL (ref 3.8–10.8)

## 2018-03-30 LAB — COMPREHENSIVE METABOLIC PANEL
AG Ratio: 1.6 (calc) (ref 1.0–2.5)
ALT: 11 U/L (ref 9–46)
AST: 16 U/L (ref 10–40)
Albumin: 4.2 g/dL (ref 3.6–5.1)
Alkaline phosphatase (APISO): 63 U/L (ref 40–115)
BUN: 12 mg/dL (ref 7–25)
CO2: 32 mmol/L (ref 20–32)
Calcium: 9.1 mg/dL (ref 8.6–10.3)
Chloride: 106 mmol/L (ref 98–110)
Creat: 0.9 mg/dL (ref 0.60–1.35)
Globulin: 2.6 g/dL (calc) (ref 1.9–3.7)
Glucose, Bld: 98 mg/dL (ref 65–99)
Potassium: 4.4 mmol/L (ref 3.5–5.3)
Sodium: 144 mmol/L (ref 135–146)
Total Bilirubin: 0.7 mg/dL (ref 0.2–1.2)
Total Protein: 6.8 g/dL (ref 6.1–8.1)

## 2018-03-30 LAB — PHOSPHORUS: PHOSPHORUS: 2.7 mg/dL (ref 2.5–4.5)

## 2018-03-30 LAB — AMYLASE: Amylase: 53 U/L (ref 21–101)

## 2018-03-30 LAB — CK: Total CK: 69 U/L (ref 44–196)

## 2018-03-30 LAB — HIV ANTIBODY (ROUTINE TESTING W REFLEX): HIV 1&2 Ab, 4th Generation: NONREACTIVE

## 2018-03-30 LAB — LIPASE: Lipase: 13 U/L (ref 7–60)

## 2018-05-10 ENCOUNTER — Encounter (INDEPENDENT_AMBULATORY_CARE_PROVIDER_SITE_OTHER): Payer: Self-pay

## 2018-05-10 VITALS — BP 114/74 | HR 103 | Temp 97.9°F | Wt 140.0 lb

## 2018-05-10 DIAGNOSIS — Z006 Encounter for examination for normal comparison and control in clinical research program: Secondary | ICD-10-CM

## 2018-05-11 LAB — HEPATITIS C ANTIBODY
Hepatitis C Ab: NONREACTIVE
SIGNAL TO CUT-OFF: 0.04 (ref <1.00)

## 2018-05-11 LAB — COMPREHENSIVE METABOLIC PANEL
AG Ratio: 1.6 (calc) (ref 1.0–2.5)
ALT: 12 U/L (ref 9–46)
AST: 19 U/L (ref 10–40)
Albumin: 4.3 g/dL (ref 3.6–5.1)
Alkaline phosphatase (APISO): 62 U/L (ref 36–130)
BUN: 14 mg/dL (ref 7–25)
CO2: 27 mmol/L (ref 20–32)
Calcium: 9.5 mg/dL (ref 8.6–10.3)
Chloride: 104 mmol/L (ref 98–110)
Creat: 0.88 mg/dL (ref 0.60–1.35)
GLUCOSE: 76 mg/dL (ref 65–99)
Globulin: 2.7 g/dL (calc) (ref 1.9–3.7)
Potassium: 3.7 mmol/L (ref 3.5–5.3)
Sodium: 142 mmol/L (ref 135–146)
Total Bilirubin: 0.5 mg/dL (ref 0.2–1.2)
Total Protein: 7 g/dL (ref 6.1–8.1)

## 2018-05-11 LAB — CBC WITH DIFFERENTIAL/PLATELET
Absolute Monocytes: 390 cells/uL (ref 200–950)
BASOS PCT: 0.4 %
Basophils Absolute: 19 cells/uL (ref 0–200)
Eosinophils Absolute: 71 cells/uL (ref 15–500)
Eosinophils Relative: 1.5 %
HCT: 41.3 % (ref 38.5–50.0)
Hemoglobin: 14 g/dL (ref 13.2–17.1)
Lymphs Abs: 1786 cells/uL (ref 850–3900)
MCH: 31.8 pg (ref 27.0–33.0)
MCHC: 33.9 g/dL (ref 32.0–36.0)
MCV: 93.9 fL (ref 80.0–100.0)
MPV: 9.7 fL (ref 7.5–12.5)
Monocytes Relative: 8.3 %
Neutro Abs: 2435 cells/uL (ref 1500–7800)
Neutrophils Relative %: 51.8 %
Platelets: 228 10*3/uL (ref 140–400)
RBC: 4.4 10*6/uL (ref 4.20–5.80)
RDW: 11.1 % (ref 11.0–15.0)
Total Lymphocyte: 38 %
WBC: 4.7 10*3/uL (ref 3.8–10.8)

## 2018-05-11 LAB — URINALYSIS
Bilirubin Urine: NEGATIVE
Glucose, UA: NEGATIVE
Hgb urine dipstick: NEGATIVE
Ketones, ur: NEGATIVE
Leukocytes,Ua: NEGATIVE
Nitrite: NEGATIVE
PROTEIN: NEGATIVE
Specific Gravity, Urine: 1.025 (ref 1.001–1.03)
pH: 7 (ref 5.0–8.0)

## 2018-05-11 LAB — PHOSPHORUS: Phosphorus: 3.3 mg/dL (ref 2.5–4.5)

## 2018-05-11 LAB — HIV ANTIBODY (ROUTINE TESTING W REFLEX): HIV 1&2 Ab, 4th Generation: NONREACTIVE

## 2018-05-11 LAB — CK: Total CK: 90 U/L (ref 44–196)

## 2018-05-11 LAB — RPR: RPR Ser Ql: NONREACTIVE

## 2018-05-11 LAB — AMYLASE: Amylase: 47 U/L (ref 21–101)

## 2018-05-11 LAB — C. TRACHOMATIS/N. GONORRHOEAE RNA
C. trachomatis RNA, TMA: NOT DETECTED
N. gonorrhoeae RNA, TMA: NOT DETECTED

## 2018-05-11 LAB — LIPASE: Lipase: 15 U/L (ref 7–60)

## 2018-05-11 NOTE — Research (Signed)
Participant here for study [id number]. He has been doing well with taking his medication daily. Rapid HIV non-reactive. He has no new concerns or complaints. Today he is transitioning to step 3 of the study where he will receive open label truvada. He was encouraged to contact the site if he has any needs or concerns prior to his next study visit. Adherence tactics were discussed. Next follow up is scheduled for 12 weeks from today.

## 2018-05-14 LAB — CT/NG RNA, TMA RECTAL
Chlamydia Trachomatis RNA: NOT DETECTED
Neisseria Gonorrhoeae RNA: NOT DETECTED

## 2018-08-09 ENCOUNTER — Encounter (INDEPENDENT_AMBULATORY_CARE_PROVIDER_SITE_OTHER): Payer: Self-pay

## 2018-08-09 ENCOUNTER — Other Ambulatory Visit: Payer: Self-pay

## 2018-08-09 VITALS — BP 114/74 | HR 81 | Temp 98.1°F | Wt 136.5 lb

## 2018-08-09 DIAGNOSIS — Z006 Encounter for examination for normal comparison and control in clinical research program: Secondary | ICD-10-CM

## 2018-08-10 LAB — HIV ANTIBODY (ROUTINE TESTING W REFLEX): HIV 1&2 Ab, 4th Generation: NONREACTIVE

## 2018-08-10 NOTE — Research (Signed)
Participant here for [id number] visit for open label follow up. He has been doing well with taking his truvada daily. Today's rapid HIV non-reactive. He has no new complaints or concerns. He is scheduled for his next study visit on 10/18/18.

## 2018-09-18 ENCOUNTER — Other Ambulatory Visit: Payer: Self-pay | Admitting: *Deleted

## 2018-09-18 DIAGNOSIS — Z20822 Contact with and (suspected) exposure to covid-19: Secondary | ICD-10-CM

## 2018-09-29 LAB — NOVEL CORONAVIRUS, NAA: SARS-CoV-2, NAA: NOT DETECTED

## 2018-10-18 ENCOUNTER — Encounter (INDEPENDENT_AMBULATORY_CARE_PROVIDER_SITE_OTHER): Payer: Self-pay | Admitting: *Deleted

## 2018-10-18 ENCOUNTER — Other Ambulatory Visit: Payer: Self-pay

## 2018-10-18 VITALS — BP 119/76 | HR 92 | Temp 99.2°F | Wt 136.5 lb

## 2018-10-18 DIAGNOSIS — Z006 Encounter for examination for normal comparison and control in clinical research program: Secondary | ICD-10-CM

## 2018-10-18 NOTE — Research (Signed)
Roy Wheeler was here for his week 24 visit/step 3 for HPTN 083. He denies any new problems or adherence issues. We did do STD testing at this visit. We discussed his unblinding results, he was on cabotegravir and I asked him if he would like to go back on the injections if the study allowed it, and he said yes. He will be returning in 12 weeks for the next study visit.

## 2018-10-19 LAB — COMPREHENSIVE METABOLIC PANEL
AG Ratio: 2 (calc) (ref 1.0–2.5)
ALT: 17 U/L (ref 9–46)
AST: 22 U/L (ref 10–40)
Albumin: 4.6 g/dL (ref 3.6–5.1)
Alkaline phosphatase (APISO): 66 U/L (ref 36–130)
BUN: 15 mg/dL (ref 7–25)
CO2: 28 mmol/L (ref 20–32)
Calcium: 9.3 mg/dL (ref 8.6–10.3)
Chloride: 105 mmol/L (ref 98–110)
Creat: 1.03 mg/dL (ref 0.60–1.35)
Globulin: 2.3 g/dL (calc) (ref 1.9–3.7)
Glucose, Bld: 102 mg/dL — ABNORMAL HIGH (ref 65–99)
Potassium: 3.7 mmol/L (ref 3.5–5.3)
Sodium: 139 mmol/L (ref 135–146)
Total Bilirubin: 0.6 mg/dL (ref 0.2–1.2)
Total Protein: 6.9 g/dL (ref 6.1–8.1)

## 2018-10-19 LAB — LIPASE: Lipase: 15 U/L (ref 7–60)

## 2018-10-19 LAB — AMYLASE: Amylase: 42 U/L (ref 21–101)

## 2018-10-19 LAB — HIV ANTIBODY (ROUTINE TESTING W REFLEX): HIV 1&2 Ab, 4th Generation: NONREACTIVE

## 2018-10-19 LAB — CK: Total CK: 113 U/L (ref 44–196)

## 2018-10-19 LAB — RPR: RPR Ser Ql: NONREACTIVE

## 2018-10-19 LAB — C. TRACHOMATIS/N. GONORRHOEAE RNA
C. trachomatis RNA, TMA: NOT DETECTED
N. gonorrhoeae RNA, TMA: NOT DETECTED

## 2018-10-19 LAB — PHOSPHORUS: Phosphorus: 3.5 mg/dL (ref 2.5–4.5)

## 2018-10-24 LAB — CT/NG RNA, TMA RECTAL
Chlamydia Trachomatis RNA: NOT DETECTED
Neisseria Gonorrhoeae RNA: NOT DETECTED

## 2019-01-10 ENCOUNTER — Encounter (INDEPENDENT_AMBULATORY_CARE_PROVIDER_SITE_OTHER): Payer: Self-pay | Admitting: *Deleted

## 2019-01-10 ENCOUNTER — Other Ambulatory Visit: Payer: Self-pay

## 2019-01-10 VITALS — BP 115/74 | HR 96 | Temp 98.3°F | Wt 130.4 lb

## 2019-01-10 DIAGNOSIS — Z006 Encounter for examination for normal comparison and control in clinical research program: Secondary | ICD-10-CM

## 2019-01-10 NOTE — Research (Signed)
Roy Wheeler was here for the [place] 083 study. He denies any new problems or concerns. Since this is his last visit where he will be dispensed PREP we scheduled him for an appt. With the [place] [facility] in January. Hopefully the study will extend the availability of cabotegravir before then and he will be able to stay on study with that. He will be returning for his last study visit on 2/3 otherwise.

## 2019-01-12 LAB — HIV ANTIBODY (ROUTINE TESTING W REFLEX): HIV 1&2 Ab, 4th Generation: NONREACTIVE

## 2019-02-28 ENCOUNTER — Other Ambulatory Visit: Payer: Self-pay

## 2019-03-01 ENCOUNTER — Ambulatory Visit: Payer: HRSA Program | Attending: Internal Medicine

## 2019-03-01 DIAGNOSIS — Z20828 Contact with and (suspected) exposure to other viral communicable diseases: Secondary | ICD-10-CM | POA: Insufficient documentation

## 2019-03-01 DIAGNOSIS — Z20822 Contact with and (suspected) exposure to covid-19: Secondary | ICD-10-CM

## 2019-03-03 LAB — NOVEL CORONAVIRUS, NAA: SARS-CoV-2, NAA: NOT DETECTED

## 2019-03-28 ENCOUNTER — Encounter: Payer: Self-pay | Admitting: Pharmacist

## 2019-04-15 ENCOUNTER — Other Ambulatory Visit: Payer: Self-pay

## 2019-04-15 ENCOUNTER — Telehealth: Payer: Self-pay | Admitting: Pharmacy Technician

## 2019-04-15 ENCOUNTER — Encounter (INDEPENDENT_AMBULATORY_CARE_PROVIDER_SITE_OTHER): Payer: Self-pay | Admitting: *Deleted

## 2019-04-15 VITALS — BP 108/74 | HR 85 | Temp 98.1°F | Wt 137.1 lb

## 2019-04-15 DIAGNOSIS — Z006 Encounter for examination for normal comparison and control in clinical research program: Secondary | ICD-10-CM

## 2019-04-15 NOTE — Research (Signed)
Roy Wheeler is here for his step 3/week 48 visit. This is his final study visit for version 3 of the study. He is interested in returning to study to receive cabotegravir injections again once version 4 is available. He will transition to the [facility] PrEP clinic for now and did meet with Lorene Dy to discuss transition. Denies any new problems or medications. Verbalized excellent adherence with his study medication. Rapid HIV was non-reactive today.

## 2019-04-15 NOTE — Telephone Encounter (Addendum)
[facility] Patient Advocate Encounter   Patient has been approved for [employer] Advancing Access Patient Assistance Program for Truvada  04/15/2019 to 03/09/2020. This assistance will make the patient's copay $0.  I have spoken with the patient and they will receive their medication from [facility].  The billing information is:  Member ID: [id number] RxBin: [id number] PCN: [id number] Group: [id number]  Patient knows to call the office with questions or concerns. For full application processing information is 1HH and $1700 per month.  Beulah Gandy, CPhT Specialty Pharmacy Patient [facility] for Infectious Disease Phone: [phone] Fax: [phone] 04/15/2019 1:11 PM

## 2019-04-15 NOTE — Telephone Encounter (Signed)
[facility] Patient Advocate Encounter  Met with the patient this morning and coordinated his transition to [facility] for his Truvada. He has approximately 20 tablets remaining. We will coordinate to have his medication to be shipped 02/18 and his follow up appointment first of May. He prefers to have the medication shipped to the home address listed. I provided him the pharmacy number for when they call to set up the second refill. He is uninsured and will need Advancing Access coverage.

## 2019-04-16 LAB — C. TRACHOMATIS/N. GONORRHOEAE RNA
C. trachomatis RNA, TMA: NOT DETECTED
N. gonorrhoeae RNA, TMA: NOT DETECTED

## 2019-04-18 LAB — COMPREHENSIVE METABOLIC PANEL
AG Ratio: 1.9 (calc) (ref 1.0–2.5)
ALT: 24 U/L (ref 9–46)
AST: 22 U/L (ref 10–40)
Albumin: 4.6 g/dL (ref 3.6–5.1)
Alkaline phosphatase (APISO): 71 U/L (ref 36–130)
BUN: 17 mg/dL (ref 7–25)
CO2: 32 mmol/L (ref 20–32)
Calcium: 9.4 mg/dL (ref 8.6–10.3)
Chloride: 103 mmol/L (ref 98–110)
Creat: 0.92 mg/dL (ref 0.60–1.35)
Globulin: 2.4 g/dL (calc) (ref 1.9–3.7)
Glucose, Bld: 88 mg/dL (ref 65–99)
Potassium: 3.8 mmol/L (ref 3.5–5.3)
Sodium: 139 mmol/L (ref 135–146)
Total Bilirubin: 0.6 mg/dL (ref 0.2–1.2)
Total Protein: 7 g/dL (ref 6.1–8.1)

## 2019-04-18 LAB — AMYLASE: Amylase: 57 U/L (ref 21–101)

## 2019-04-18 LAB — CK: Total CK: 71 U/L (ref 44–196)

## 2019-04-18 LAB — HIV ANTIBODY (ROUTINE TESTING W REFLEX): HIV 1&2 Ab, 4th Generation: NONREACTIVE

## 2019-04-18 LAB — RPR: RPR Ser Ql: NONREACTIVE

## 2019-04-18 LAB — PHOSPHORUS: Phosphorus: 3.5 mg/dL (ref 2.5–4.5)

## 2019-04-18 LAB — LIPASE: Lipase: 15 U/L (ref 7–60)

## 2019-04-19 ENCOUNTER — Other Ambulatory Visit: Payer: Self-pay | Admitting: Pharmacist

## 2019-04-19 DIAGNOSIS — Z79899 Other long term (current) drug therapy: Secondary | ICD-10-CM

## 2019-04-19 MED ORDER — EMTRICITABINE-TENOFOVIR DF 200-300 MG PO TABS: 1.0000 | ORAL_TABLET | Freq: Every day | ORAL | 2 refills | Status: DC

## 2019-04-19 NOTE — Progress Notes (Signed)
Sending Truvada to [facility] for patient's PrEP regimen.

## 2019-04-21 LAB — CT/NG RNA, TMA RECTAL
Chlamydia Trachomatis RNA: NOT DETECTED
Neisseria Gonorrhoeae RNA: NOT DETECTED

## 2019-04-27 MED FILL — TRUVADA 200-300 MG TABS: 200-300 | 30 days supply | Qty: 30 | Fill #0

## 2019-06-03 MED FILL — TRUVADA 200-300 MG TABS: 200-300 | 30 days supply | Qty: 30 | Fill #1

## 2019-06-06 IMAGING — CT CT ABD-PELV W/ CM
2 of 4 series · 16 of 46 positions shown, 18 images · IV contrast (iopamidol)
Comparison: None

CLINICAL DATA: Generalized abdominal pain, nausea, and vomiting for
2 days, blood in stool

EXAM:
CT ABDOMEN AND PELVIS WITH CONTRAST
TECHNIQUE: Multidetector CT imaging of the abdomen and pelvis was performed
using the standard protocol following bolus administration of
intravenous contrast. Sagittal and coronal MPR images reconstructed
from axial data set.
CONTRAST:  100mL [id number] IOPAMIDOL ([id number]) INJECTION 61% IV.
No oral contrast.

[Series 3: abdomen 5.0 · axial · 0.53mm/px · z∈[+1241,+1601]mm · 13 of 82 slices shown, 15 images]
[im 5/82  soft-tissue]
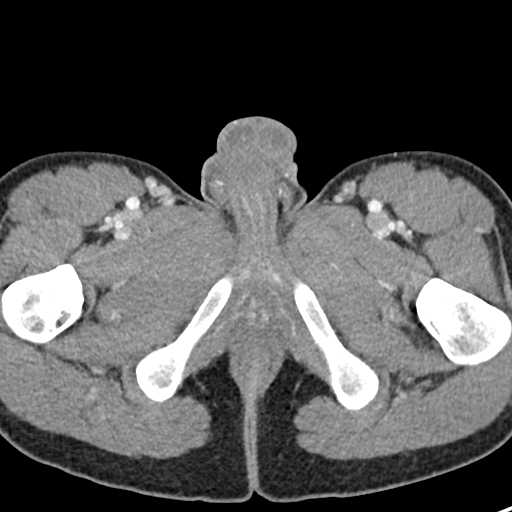
[im 5/82  bone]
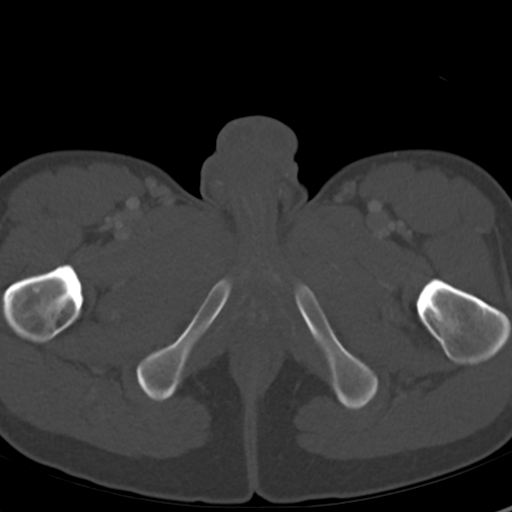
[im 13/82  soft-tissue]
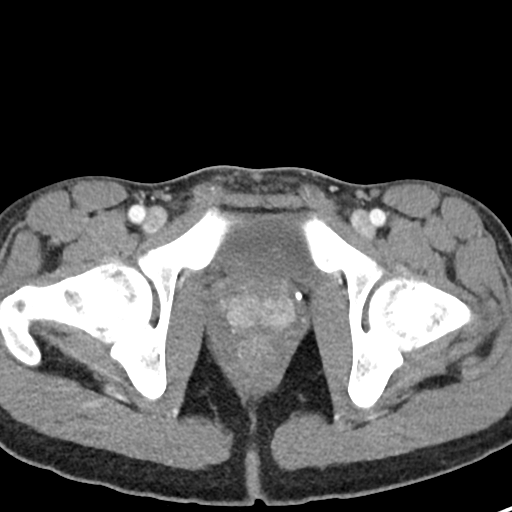
[im 18/82  soft-tissue]
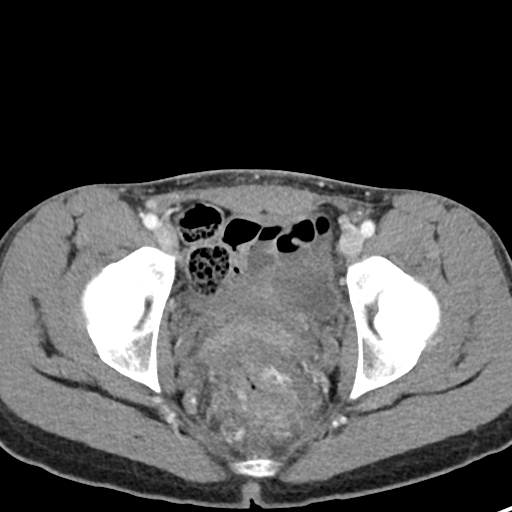
[im 22/82  soft-tissue]
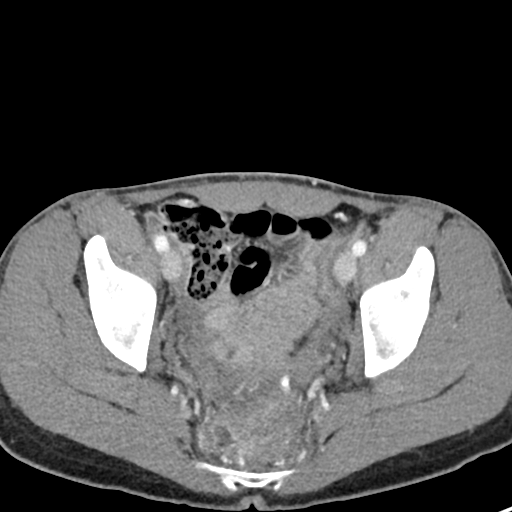
[im 30/82  soft-tissue]
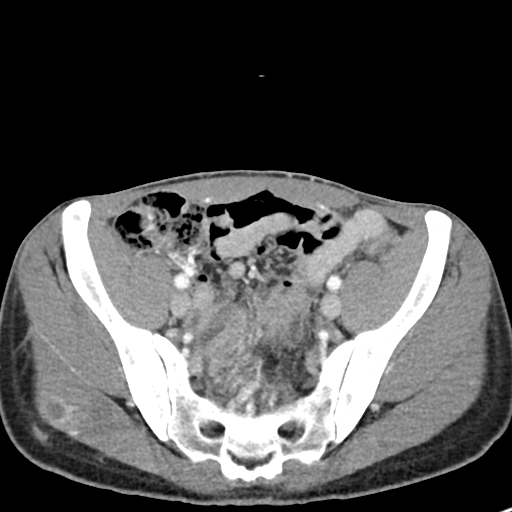
[im 35/82  soft-tissue]
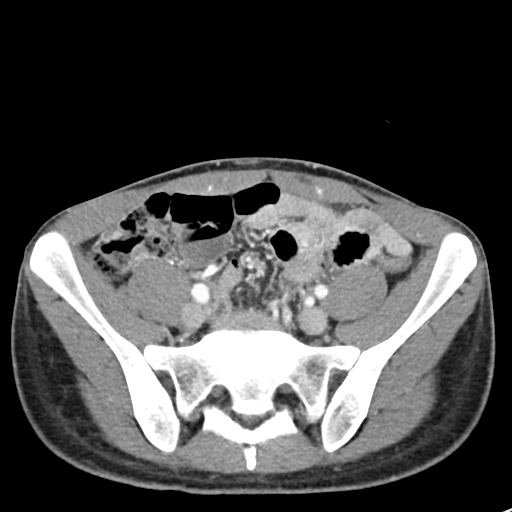
[im 43/82  soft-tissue]
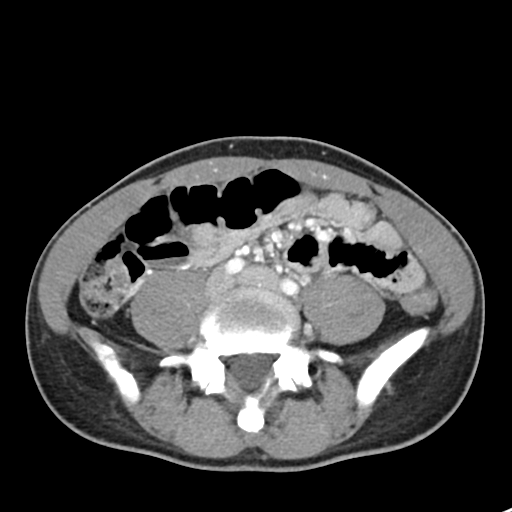
[im 47/82  soft-tissue]
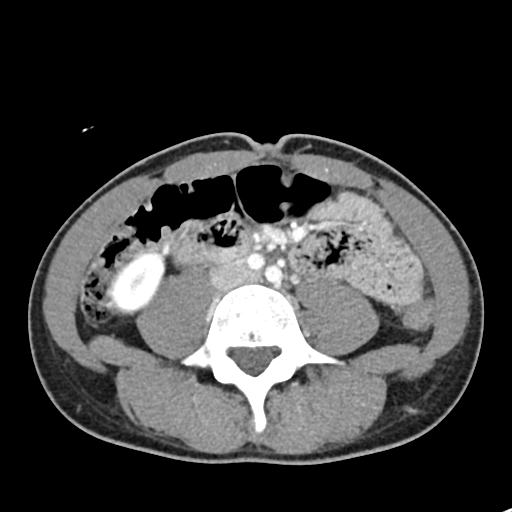
[im 52/82  soft-tissue]
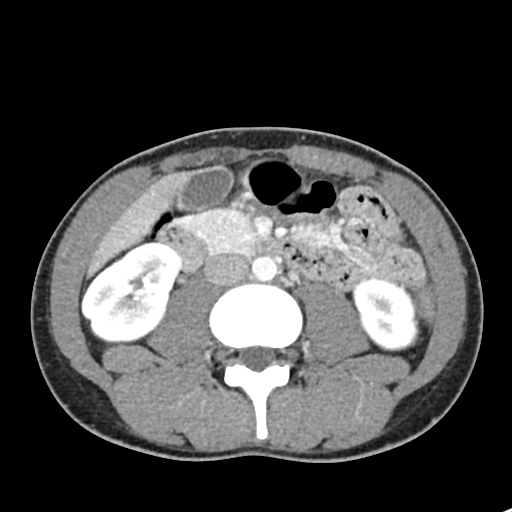
[im 52/82  bone]
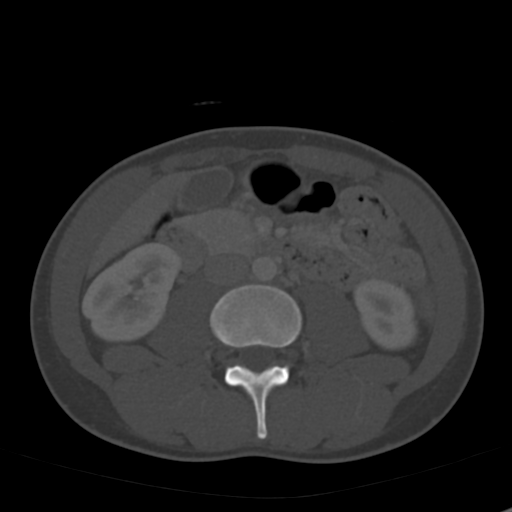
[im 60/82  soft-tissue]
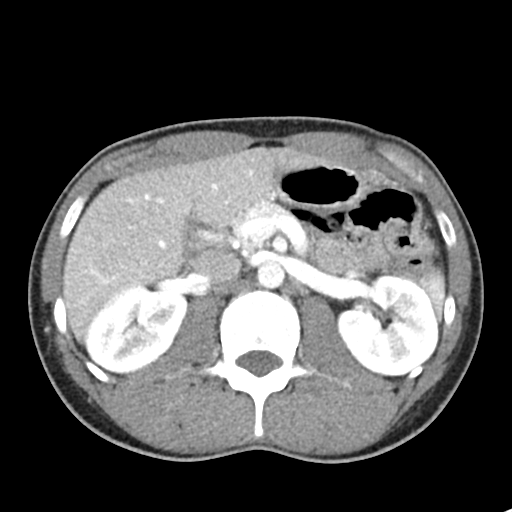
[im 64/82  soft-tissue]
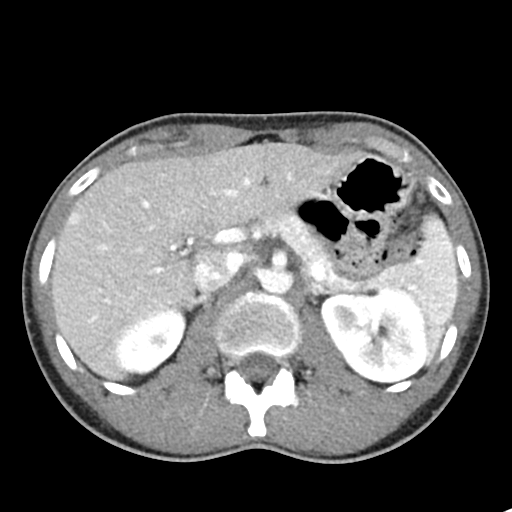
[im 69/82  soft-tissue]
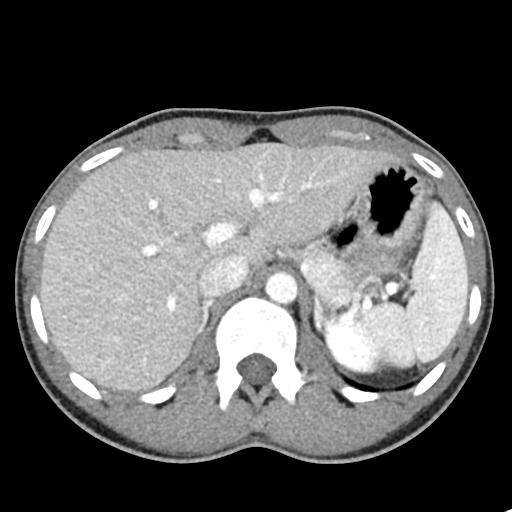
[im 77/82  soft-tissue]
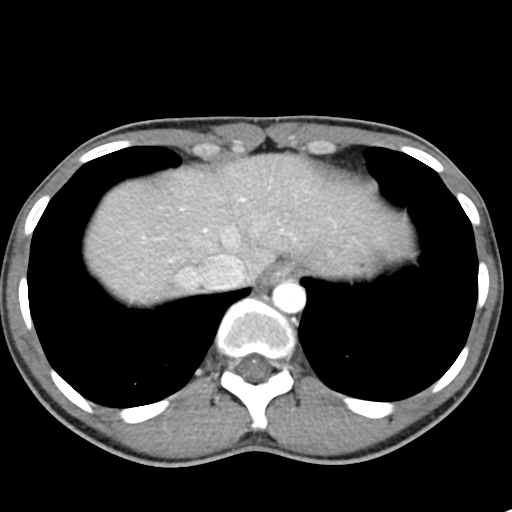

[Series 6: abdomen 3.0 mpr cor · coronal · 0.63mm/px · 3 of 64 slices shown]
[im 22/64  soft-tissue]
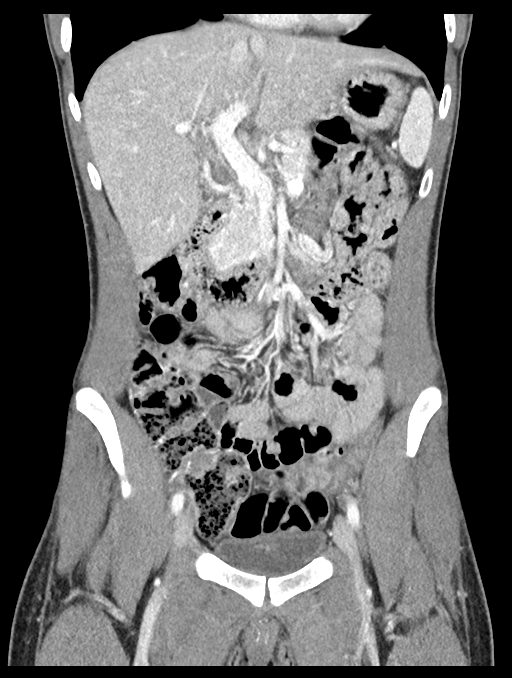
[im 29/64  soft-tissue]
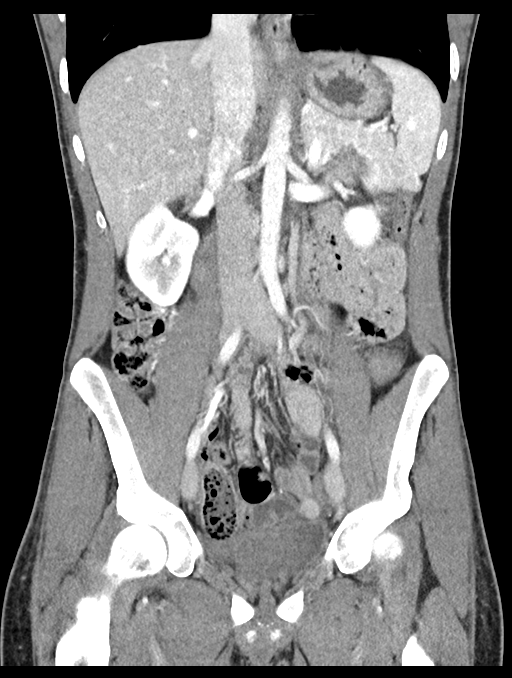
[im 36/64  soft-tissue]
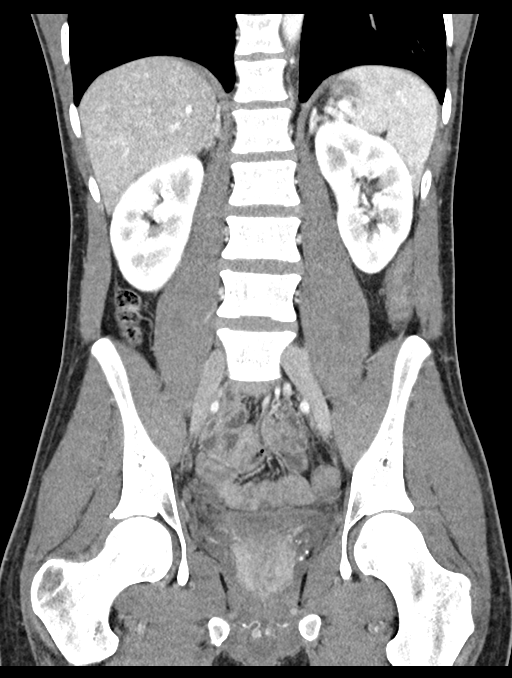

[16 of 46 positions shown; findings below may reference images not displayed]

FINDINGS: Lower chest: Lung bases clear

Hepatobiliary: Gallbladder and liver normal appearance

Pancreas: Normal appearance

Spleen: Normal appearance

Adrenals/Urinary Tract: Adrenal glands, kidneys, ureters, and
bladder normal appearance

Stomach/Bowel: Suboptimal assessment of bowel in tissue planes in
the pelvis due to lack of GI contrast and adequate fat planes.
Marked wall thickening of the rectum and distal sigmoid colon
compatible with colitis. Hyperemia of the rectum and perirectal soft
tissues. Remainder of colon grossly unremarkable. Appendix normal.
Stomach and small bowel loops unremarkable for technique.

Vascular/Lymphatic: Vascular structures grossly patent. No definite
adenopathy.

Reproductive: N/A

Other: Scattered perirectal and presacral edema. No free
intraperitoneal air or fluid. No hernia.

Musculoskeletal: Unremarkable
IMPRESSION: Marked wall thickening of the rectum and distal sigmoid colon with
surrounding hyperemia and soft tissue edema compatible with colitis.

Differential diagnosis includes inflammatory bowel disease and
infection.

No definite evidence of perforation or abscess though assessment of
tissue planes and bowel loops in the pelvis is suboptimal.

Remainder of exam unremarkable.

## 2019-06-29 MED FILL — TRUVADA 200-300 MG TABS: 200-300 | 30 days supply | Qty: 30 | Fill #2

## 2019-08-18 ENCOUNTER — Encounter (INDEPENDENT_AMBULATORY_CARE_PROVIDER_SITE_OTHER): Payer: Self-pay | Admitting: *Deleted

## 2019-08-18 ENCOUNTER — Other Ambulatory Visit: Payer: Self-pay

## 2019-08-18 VITALS — BP 113/72 | HR 65 | Temp 97.8°F | Wt 140.4 lb

## 2019-08-18 DIAGNOSIS — Z006 Encounter for examination for normal comparison and control in clinical research program: Secondary | ICD-10-CM

## 2019-08-18 NOTE — Research (Signed)
Roy Wheeler is interested in returning to study to receive Cabotegravir injections again. Roy Wheeler completed Step 3 of the study in February and transitioned to the [facility] PrEP clinic and continued on Truvada. Roy Wheeler has been very adherent with his medication. Roy Wheeler is here today to discuss/review Version 4.0 of the protocol. Risk, benefits, responsibilities and other options were reviewed. I answered his questions. Roy Wheeler verbalized understanding and signed informed consent was obtained by me. His most recent safety labs were obtained in February. Roy Wheeler said that Roy Wheeler did have an HIV test done at the health department in May, which was non-reactive. We obtained a HIV antibody test today per Dr. Daiva Eves. Roy Wheeler is scheduled to return next week for his entry visit to Step 4B pending labs today.

## 2019-08-19 LAB — HIV ANTIBODY (ROUTINE TESTING W REFLEX): HIV 1&2 Ab, 4th Generation: NONREACTIVE

## 2019-08-23 ENCOUNTER — Other Ambulatory Visit: Payer: Self-pay

## 2019-08-23 ENCOUNTER — Encounter (INDEPENDENT_AMBULATORY_CARE_PROVIDER_SITE_OTHER): Payer: Self-pay | Admitting: *Deleted

## 2019-08-23 VITALS — BP 113/69 | HR 88 | Temp 97.7°F | Wt 141.5 lb

## 2019-08-23 DIAGNOSIS — Z006 Encounter for examination for normal comparison and control in clinical research program: Secondary | ICD-10-CM

## 2019-08-23 MED ORDER — STUDY - HPTN 083 (STEP 4, OPEN-LABEL) - CABOTEGRAVIR 600MG/3ML INJECTION (PI-VAN DAM): 600.0000 mg | INJECTION | INTRAMUSCULAR | Status: DC

## 2019-08-23 NOTE — Research (Signed)
Aspen is here for his Step 4B week 0 HPTN 083 visit. No new complaints or concerns. He has received both doses of his Covid-19 vaccine. Rapid HIV non-reactive today. Cabotegravir injection was administered to (R) glute without problem. He will return in 1 month for his next injection and then study visits will go to every 2 months.

## 2019-08-25 LAB — HEPATIC FUNCTION PANEL
AG Ratio: 1.9 (calc) (ref 1.0–2.5)
ALT: 19 U/L (ref 9–46)
AST: 17 U/L (ref 10–40)
Albumin: 4.4 g/dL (ref 3.6–5.1)
Alkaline phosphatase (APISO): 64 U/L (ref 36–130)
Bilirubin, Direct: 0.1 mg/dL (ref 0.0–0.2)
Globulin: 2.3 g/dL (calc) (ref 1.9–3.7)
Indirect Bilirubin: 0.4 mg/dL (calc) (ref 0.2–1.2)
Total Bilirubin: 0.5 mg/dL (ref 0.2–1.2)
Total Protein: 6.7 g/dL (ref 6.1–8.1)

## 2019-08-25 LAB — HIV ANTIBODY (ROUTINE TESTING W REFLEX): HIV 1&2 Ab, 4th Generation: NONREACTIVE

## 2019-08-25 LAB — CREATININE, SERUM: Creat: 0.95 mg/dL (ref 0.60–1.35)

## 2019-08-25 LAB — HIV-1 RNA QUANT-NO REFLEX-BLD
HIV 1 RNA Quant: <20 copies/mL
HIV-1 RNA Quant, Log: <1.3 Log copies/mL

## 2019-09-20 ENCOUNTER — Other Ambulatory Visit: Payer: Self-pay

## 2019-09-20 ENCOUNTER — Encounter (INDEPENDENT_AMBULATORY_CARE_PROVIDER_SITE_OTHER): Payer: Self-pay | Admitting: *Deleted

## 2019-09-20 VITALS — BP 111/71 | HR 84 | Temp 98.2°F | Wt 141.1 lb

## 2019-09-20 DIAGNOSIS — Z006 Encounter for examination for normal comparison and control in clinical research program: Secondary | ICD-10-CM

## 2019-09-20 NOTE — Research (Signed)
Roy Wheeler is here for [name] week 0 HPTN 083 study visit. States that he had 2 days of mild injection site tenderness after his last injection. He did apply warm moist heat to the area which he states did provide relief. No other complaints verbalized. Rapid HIV was non-reactive today. Cabotegravir injection given (R) glute without problem. He will return in September for his next study visit.

## 2019-09-23 LAB — HEPATIC FUNCTION PANEL
AG Ratio: 1.8 (calc) (ref 1.0–2.5)
ALT: 13 U/L (ref 9–46)
AST: 17 U/L (ref 10–40)
Albumin: 4.6 g/dL (ref 3.6–5.1)
Alkaline phosphatase (APISO): 66 U/L (ref 36–130)
Bilirubin, Direct: 0.1 mg/dL (ref 0.0–0.2)
Globulin: 2.5 g/dL (calc) (ref 1.9–3.7)
Indirect Bilirubin: 0.5 mg/dL (calc) (ref 0.2–1.2)
Total Bilirubin: 0.6 mg/dL (ref 0.2–1.2)
Total Protein: 7.1 g/dL (ref 6.1–8.1)

## 2019-09-23 LAB — HIV-1 RNA QUANT-NO REFLEX-BLD
HIV 1 RNA Quant: <20 copies/mL
HIV-1 RNA Quant, Log: <1.3 Log copies/mL

## 2019-09-23 LAB — CREATININE, SERUM: Creat: 0.95 mg/dL (ref 0.60–1.35)

## 2019-09-23 LAB — HIV ANTIBODY (ROUTINE TESTING W REFLEX): HIV 1&2 Ab, 4th Generation: NONREACTIVE

## 2019-11-15 ENCOUNTER — Other Ambulatory Visit: Payer: Self-pay

## 2019-11-15 ENCOUNTER — Encounter (INDEPENDENT_AMBULATORY_CARE_PROVIDER_SITE_OTHER): Payer: Self-pay | Admitting: *Deleted

## 2019-11-15 VITALS — BP 118/72 | HR 91 | Temp 99.0°F | Wt 140.5 lb

## 2019-11-15 DIAGNOSIS — Z006 Encounter for examination for normal comparison and control in clinical research program: Secondary | ICD-10-CM

## 2019-11-15 NOTE — Research (Signed)
Roy Wheeler was here for his week 8, step 4c visit for [facility] 083. He denied any problems with his last injection or any new complaints. He was given an injection of cabotegravir in his rt buttock without problem. He will be returning in 8 weeks for the next visit.

## 2019-11-18 LAB — HIV ANTIBODY (ROUTINE TESTING W REFLEX): HIV 1&2 Ab, 4th Generation: NONREACTIVE

## 2019-11-18 LAB — HIV-1 RNA QUANT-NO REFLEX-BLD
HIV 1 RNA Quant: <20 Copies/mL
HIV-1 RNA Quant, Log: <1.3 Log cps/mL

## 2020-01-12 ENCOUNTER — Encounter (INDEPENDENT_AMBULATORY_CARE_PROVIDER_SITE_OTHER): Payer: Self-pay | Admitting: *Deleted

## 2020-01-12 ENCOUNTER — Other Ambulatory Visit: Payer: Self-pay

## 2020-01-12 VITALS — BP 109/72 | HR 88 | Temp 97.7°F | Wt 146.5 lb

## 2020-01-12 DIAGNOSIS — Z006 Encounter for examination for normal comparison and control in clinical research program: Secondary | ICD-10-CM

## 2020-01-12 NOTE — Research (Signed)
Roy Wheeler seen today for [name] week 16 HPTN 083 study visit. States that he did have a small knot after his last injection which lasted 4 days. Applied warm moist heat to the area during this time with relief. Rapid HIV non-reactive today. Cabotegravir injection given (R) glute without problem. He will return in December for his next study visit and injection.

## 2020-01-15 LAB — HIV-1 RNA QUANT-NO REFLEX-BLD
HIV 1 RNA Quant: <20 Copies/mL
HIV-1 RNA Quant, Log: <1.3 Log cps/mL

## 2020-01-15 LAB — HIV ANTIBODY (ROUTINE TESTING W REFLEX): HIV 1&2 Ab, 4th Generation: NONREACTIVE

## 2020-03-08 ENCOUNTER — Encounter (INDEPENDENT_AMBULATORY_CARE_PROVIDER_SITE_OTHER): Payer: Self-pay | Admitting: *Deleted

## 2020-03-08 ENCOUNTER — Other Ambulatory Visit: Payer: Self-pay

## 2020-03-08 VITALS — BP 123/77 | HR 83 | Temp 98.2°F | Wt 150.2 lb

## 2020-03-08 DIAGNOSIS — Z006 Encounter for examination for normal comparison and control in clinical research program: Secondary | ICD-10-CM

## 2020-03-08 NOTE — Research (Signed)
Roy Wheeler here for [name] week 24 HPTN 083 study visit. Denied any problems after his last injection. No injection site reactions. No new complaints or medications. Rapid HIV non-reactive today. Cabotegravir injection given (R) glute without problem. He will return in February for his next study visit and injection.

## 2020-03-09 LAB — C. TRACHOMATIS/N. GONORRHOEAE RNA
C. trachomatis RNA, TMA: NOT DETECTED
N. gonorrhoeae RNA, TMA: NOT DETECTED

## 2020-03-12 ENCOUNTER — Telehealth: Payer: Self-pay

## 2020-03-12 NOTE — Telephone Encounter (Signed)
Spoke with patient to relay positive RPR results per Dr. Daiva Eves. Scheduled patient for first of three Bicillin injections. Advised patient to notify partners for testing and treatment and no sex for 10 days after treatment is completed. Verified no known drug allergies. Patient verbalized understanding and has no further questions.   Sandie Ano, RN

## 2020-03-12 NOTE — Telephone Encounter (Signed)
-----   Message from Randall Hiss, MD sent at 03/12/2020  4:35 PM EST ----- He needs 2.4 MU of Penicillin weekly x 3 doses

## 2020-03-13 ENCOUNTER — Telehealth: Payer: Self-pay | Admitting: *Deleted

## 2020-03-13 LAB — CT/NG RNA, TMA RECTAL
Chlamydia Trachomatis RNA: NOT DETECTED
Neisseria Gonorrhoeae RNA: NOT DETECTED

## 2020-03-13 NOTE — Telephone Encounter (Signed)
Spoke with Orvan July regarding his positive RPR. He has an appointment at the [facility] on 03/15/20 for evaluation/treatment. He's aware that he needs to contact any recent partners to notify them of test results so they can be tested and treated.

## 2020-03-14 ENCOUNTER — Ambulatory Visit: Payer: Self-pay

## 2020-03-16 ENCOUNTER — Ambulatory Visit: Payer: Self-pay

## 2020-03-19 LAB — HEPATIC FUNCTION PANEL
AG Ratio: 1.6 (calc) (ref 1.0–2.5)
ALT: 18 U/L (ref 9–46)
AST: 18 U/L (ref 10–40)
Albumin: 4.4 g/dL (ref 3.6–5.1)
Alkaline phosphatase (APISO): 69 U/L (ref 36–130)
Bilirubin, Direct: 0.1 mg/dL (ref 0.0–0.2)
Globulin: 2.8 g/dL (calc) (ref 1.9–3.7)
Indirect Bilirubin: 0.3 mg/dL (calc) (ref 0.2–1.2)
Total Bilirubin: 0.4 mg/dL (ref 0.2–1.2)
Total Protein: 7.2 g/dL (ref 6.1–8.1)

## 2020-03-19 LAB — HIV ANTIBODY (ROUTINE TESTING W REFLEX): HIV 1&2 Ab, 4th Generation: NONREACTIVE

## 2020-03-19 LAB — FLUORESCENT TREPONEMAL AB(FTA)-IGG-BLD: Fluorescent Treponemal ABS: REACTIVE — AB

## 2020-03-19 LAB — RPR: RPR Ser Ql: REACTIVE — AB

## 2020-03-19 LAB — CREATININE, SERUM: Creat: 0.81 mg/dL (ref 0.60–1.35)

## 2020-03-19 LAB — RPR TITER: RPR Titer: 1:32 {titer} — ABNORMAL HIGH

## 2020-03-19 LAB — HIV-1 RNA QUANT-NO REFLEX-BLD
HIV 1 RNA Quant: <20 Copies/mL
HIV-1 RNA Quant, Log: <1.3 Log cps/mL

## 2020-04-30 ENCOUNTER — Encounter (INDEPENDENT_AMBULATORY_CARE_PROVIDER_SITE_OTHER): Payer: Self-pay | Admitting: *Deleted

## 2020-04-30 ENCOUNTER — Other Ambulatory Visit: Payer: Self-pay

## 2020-04-30 VITALS — BP 119/77 | HR 98 | Temp 98.1°F | Wt 151.4 lb

## 2020-04-30 DIAGNOSIS — Z006 Encounter for examination for normal comparison and control in clinical research program: Secondary | ICD-10-CM

## 2020-04-30 NOTE — Research (Signed)
Roy Wheeler seen today for [name] Week 32 HPTN 083 visit. Denied any site reactions after his last injection. He had a reactive RPR in December. He did go to the [facility] for treatment. Receive Bicillin 2.4 million unit x 1. No new complaints verbalized. Rapid HIV non-reactive today. Cabotegrair injection given (R) glute without problem. We did discuss plans for PrEP post study. He is interested in continuing on Cabotegravir once the study ends through the [facility] clinic. He will return in April for his next study visit.

## 2020-05-02 LAB — HIV-1 RNA QUANT-NO REFLEX-BLD
HIV 1 RNA Quant: <20 Copies/mL
HIV-1 RNA Quant, Log: <1.3 Log cps/mL

## 2020-05-02 LAB — HIV ANTIBODY (ROUTINE TESTING W REFLEX): HIV 1&2 Ab, 4th Generation: NONREACTIVE

## 2020-06-27 ENCOUNTER — Other Ambulatory Visit: Payer: Self-pay

## 2020-06-27 ENCOUNTER — Encounter (INDEPENDENT_AMBULATORY_CARE_PROVIDER_SITE_OTHER): Payer: Self-pay | Admitting: *Deleted

## 2020-06-27 ENCOUNTER — Other Ambulatory Visit (HOSPITAL_COMMUNITY): Payer: Self-pay

## 2020-06-27 VITALS — BP 112/76 | HR 62 | Temp 97.9°F | Wt 155.0 lb

## 2020-06-27 DIAGNOSIS — Z006 Encounter for examination for normal comparison and control in clinical research program: Secondary | ICD-10-CM

## 2020-06-27 NOTE — Research (Signed)
Roy Wheeler here today for [name] week 40 HPTN 083 study visit. Denied any site reactions after his last injection. No new complaints or medications. Rapid HIV non-reactive today. Cabotegravir injection given (R) upper outer glute without problem. He will return in June for his next study visit.

## 2020-06-29 LAB — HIV ANTIBODY (ROUTINE TESTING W REFLEX): HIV 1&2 Ab, 4th Generation: NONREACTIVE

## 2020-06-29 LAB — HIV-1 RNA QUANT-NO REFLEX-BLD
HIV 1 RNA Quant: NOT DETECTED Copies/mL
HIV-1 RNA Quant, Log: NOT DETECTED Log cps/mL

## 2020-08-21 ENCOUNTER — Other Ambulatory Visit: Payer: Self-pay

## 2020-08-21 ENCOUNTER — Encounter (INDEPENDENT_AMBULATORY_CARE_PROVIDER_SITE_OTHER): Payer: Self-pay | Admitting: *Deleted

## 2020-08-21 VITALS — BP 122/77 | HR 98 | Temp 98.6°F | Wt 154.5 lb

## 2020-08-21 DIAGNOSIS — Z006 Encounter for examination for normal comparison and control in clinical research program: Secondary | ICD-10-CM

## 2020-08-21 NOTE — Research (Signed)
Roy Wheeler here today for [name] week 48 HPTN 083 study visit. Denied any site reactions after his last injection. No new complaints or medication. Rapid HIV was non-reactive today. Cabotegravir injection given (R) glute without problem. We are currently waiting on the new study version approval. Participant would need to move to Truvada at his next visit if the new version is not approved. Will talk with Cassie about possibility of transitioning him to the [facility] PrEP clinc if Aptretude available. He is scheduled to return for his next study visit in August.

## 2020-08-22 ENCOUNTER — Other Ambulatory Visit: Payer: Self-pay

## 2020-08-22 DIAGNOSIS — Z006 Encounter for examination for normal comparison and control in clinical research program: Secondary | ICD-10-CM

## 2020-08-22 NOTE — Addendum Note (Signed)
Addended by: Valarie Cones on: 08/22/2020 02:10 PM   Modules accepted: Orders

## 2020-08-23 ENCOUNTER — Other Ambulatory Visit: Payer: Self-pay | Admitting: Infectious Disease

## 2020-08-23 ENCOUNTER — Other Ambulatory Visit (HOSPITAL_COMMUNITY): Payer: Self-pay

## 2020-08-23 LAB — HEPATIC FUNCTION PANEL
AG Ratio: 2 (calc) (ref 1.0–2.5)
ALT: 20 U/L (ref 9–46)
AST: 24 U/L (ref 10–40)
Albumin: 4.7 g/dL (ref 3.6–5.1)
Alkaline phosphatase (APISO): 60 U/L (ref 36–130)
Bilirubin, Direct: 0.1 mg/dL (ref 0.0–0.2)
Globulin: 2.4 g/dL (calc) (ref 1.9–3.7)
Indirect Bilirubin: 0.6 mg/dL (calc) (ref 0.2–1.2)
Total Bilirubin: 0.7 mg/dL (ref 0.2–1.2)
Total Protein: 7.1 g/dL (ref 6.1–8.1)

## 2020-08-23 LAB — HIV ANTIBODY (ROUTINE TESTING W REFLEX): HIV 1&2 Ab, 4th Generation: NONREACTIVE

## 2020-08-23 LAB — HIV-1 RNA QUANT-NO REFLEX-BLD
HIV 1 RNA Quant: NOT DETECTED Copies/mL
HIV-1 RNA Quant, Log: NOT DETECTED Log cps/mL

## 2020-08-23 LAB — HEPATITIS C ANTIBODY
Hepatitis C Ab: NONREACTIVE
SIGNAL TO CUT-OFF: 0.02 (ref <1.00)

## 2020-08-23 LAB — FLUORESCENT TREPONEMAL AB(FTA)-IGG-BLD: Fluorescent Treponemal ABS: REACTIVE — AB

## 2020-08-23 LAB — CREATININE, SERUM: Creat: 1 mg/dL (ref 0.60–1.35)

## 2020-08-23 LAB — RPR: RPR Ser Ql: REACTIVE — AB

## 2020-08-23 LAB — RPR TITER: RPR Titer: 1:2 {titer} — ABNORMAL HIGH

## 2020-08-23 NOTE — Addendum Note (Signed)
Addended by: Valarie Cones on: 08/23/2020 12:08 PM   Modules accepted: Orders

## 2020-08-24 LAB — C. TRACHOMATIS/N. GONORRHOEAE RNA
C. trachomatis RNA, TMA: NOT DETECTED
N. gonorrhoeae RNA, TMA: NOT DETECTED

## 2020-08-24 LAB — CT/NG RNA, TMA RECTAL
Chlamydia Trachomatis RNA: NOT DETECTED
Neisseria Gonorrhoeae RNA: NOT DETECTED

## 2020-08-28 ENCOUNTER — Other Ambulatory Visit (HOSPITAL_COMMUNITY): Payer: Self-pay

## 2020-09-03 ENCOUNTER — Telehealth: Payer: Self-pay

## 2020-09-03 NOTE — Telephone Encounter (Signed)
[facility] Patient Advocate Encounter  Completed and sent [employer] application for AAPRETUDE for this patient who is uninsured.    Patient assistance phone number for follow up is [phone].   This encounter will be updated until final determination.,   Clearance Coots, CPhT Specialty Pharmacy Patient [facility] for Infectious Disease Phone: [phone] Fax:  [phone]

## 2020-09-04 ENCOUNTER — Telehealth: Payer: Self-pay

## 2020-09-04 NOTE — Telephone Encounter (Addendum)
[facility] Patient Advocate Encounter  Completed and sent [employer] application for Apretude for this patient who is uninsured.    Patient is approved 09/04/20 through 09/04/21.  Patient ID # [id number] [facility] # [phone]   Clearance Coots, CPhT Specialty Pharmacy Patient [facility] for Infectious Disease Phone: [phone] Fax:  [phone]

## 2020-09-14 ENCOUNTER — Telehealth: Payer: Self-pay | Admitting: Pharmacist

## 2020-09-14 NOTE — Telephone Encounter (Signed)
Patient's Apretude was delivered to the clinic and will be administered at his appointment with me on 8/9.

## 2020-10-08 ENCOUNTER — Telehealth: Payer: Self-pay

## 2020-10-08 NOTE — Telephone Encounter (Signed)
[facility] Patient Advocate Encounter  Patient's medication (Apretude) have been couriered to [facility] from [employer] and will be administered on the patient next office visit on 10/16/20.  Clearance Coots , CPhT Specialty Pharmacy Patient [facility] for Infectious Disease Phone: [phone] Fax:  [phone]

## 2020-10-16 ENCOUNTER — Ambulatory Visit (INDEPENDENT_AMBULATORY_CARE_PROVIDER_SITE_OTHER): Payer: Self-pay | Admitting: Pharmacist

## 2020-10-16 ENCOUNTER — Other Ambulatory Visit: Payer: Self-pay

## 2020-10-16 ENCOUNTER — Other Ambulatory Visit: Payer: Self-pay | Admitting: Pharmacist

## 2020-10-16 DIAGNOSIS — Z79899 Other long term (current) drug therapy: Secondary | ICD-10-CM

## 2020-10-16 NOTE — Progress Notes (Signed)
retu  HPI: Roy Wheeler is a 30 y.o. male who presents to the [facility] pharmacy clinic for Apretude administration and HIV PrEP follow up.  There are no problems to display for this patient.   Patient's Medications  New Prescriptions   No medications on file  Previous Medications   BENZONATATE (TESSALON) 100 MG CAPSULE    Take 1 capsule (100 mg total) by mouth every 8 (eight) hours.   DICYCLOMINE (BENTYL) 20 MG TABLET    Take 1 tablet (20 mg total) by mouth 2 (two) times daily.   METRONIDAZOLE (FLAGYL) 500 MG TABLET    Take 1 tablet (500 mg total) by mouth 2 (two) times daily.   MULTIPLE VITAMIN (MULTIVITAMIN) CAPSULE    Take 1 capsule by mouth daily.   ONDANSETRON (ZOFRAN ODT) 4 MG DISINTEGRATING TABLET    Take 1 tablet (4 mg total) by mouth every 8 (eight) hours as needed for nausea or vomiting.   STUDY - HPTN 083 (STEP 4, OPEN-LABEL) - CABOTEGRAVIR 600 MG/3 ML INTRAMUSCULAR INJECTION (PI-VAN DAM)    Inject 3 mLs (600 mg total) into the muscle every 8 (eight) weeks.   TRAMADOL (ULTRAM) 50 MG TABLET    Take 1 tablet (50 mg total) by mouth every 6 (six) hours as needed.  Modified Medications   No medications on file  Discontinued Medications   No medications on file    Allergies: No Known Allergies  Past Medical History: No past medical history on file.  Social History: Social History   Socioeconomic History   Marital status: Single    Spouse name: Not on file   Number of children: Not on file   Years of education: Not on file   Highest education level: Not on file  Occupational History   Not on file  Tobacco Use   Smoking status: Never   Smokeless tobacco: Never  Substance and Sexual Activity   Alcohol use: Yes    Comment: rarely   Drug use: No   Sexual activity: Not on file  Other Topics Concern   Not on file  Social History Narrative   Not on file   Social Determinants of Health   Financial Resource Strain: Not on file  Food Insecurity: Not on file   Transportation Needs: Not on file  Physical Activity: Not on file  Stress: Not on file  Social Connections: Not on file    Labs: Lab Results  Component Value Date   HIV1RNAQUANT Not Detected 08/21/2020   HIV1RNAQUANT Not Detected 06/27/2020   HIV1RNAQUANT <20 04/30/2020    RPR and STI Lab Results  Component Value Date   LABRPR REACTIVE (A) 08/21/2020   LABRPR REACTIVE (A) 03/08/2020   LABRPR NON-REACTIVE 04/15/2019   LABRPR NON-REACTIVE 10/18/2018   LABRPR NON-REACTIVE 05/10/2018   RPRTITER 1:2 (H) 08/21/2020   RPRTITER 1:32 (H) 03/08/2020   RPRTITER 1:8 06/11/2015    STI Results GC CT  07/16/2016 NOT DETECTED NOT DETECTED  01/28/2016 NOT DETECTED NOT DETECTED  06/11/2015 NOT DETECTED NOT DETECTED    Hepatitis B Lab Results  Component Value Date   HEPBSAB POS (A) 06/11/2015   HEPBSAG NEGATIVE 05/29/2015   HEPBCAB NON REACTIVE 06/11/2015   Hepatitis C Lab Results  Component Value Date   HEPCAB NON-REACTIVE 08/21/2020   Hepatitis A No results found for: HAV Lipids: Lab Results  Component Value Date   CHOL 178 06/17/2017   TRIG 58 06/17/2017   HDL 46 06/17/2017   CHOLHDL 3.9 06/17/2017  VLDL 9 07/16/2016   LDLCALC 118 (H) 06/17/2017    TARGET DATE: The 9th of the month  Assessment: Roy Wheeler presents today for their Apretude injection and to follow up for HIV PrEP. He is transitioning off of our HPTN study. Past injections were tolerated well without issues - he has been getting Apretude for about a year now. No problems with systemic side effects of injection. No signs or symptoms of anything today. Screened patient for acute HIV symptoms such as fatigue, muscle aches, rash, sore throat, lymphadenopathy, headache, night sweats, nausea/vomiting/diarrhea, and fever. Patient denies any symptoms. No new partners since last injection. Counseled that condoms are still advised and encouraged. Rapid HIV antibody blood test was drawn immediately prior to injection and  was negative. Patient also had HIV RNA drawn today.   Administered cabotegravir 600mg /61mL in right upper outer quadrant of the gluteal muscle. Monitored patient for 10 minutes after injection. Injection was tolerated well without issue.  Last STI screening was 08/23/20 and was negative. No need for further kidney monitoring with Apretude. Will see him back in 2 months for injection, labs, and HIV PrEP follow up.  Plan: - Apretude injection administered - HIV rapid fingerstick antibody test (negative) and HIV RNA today - Next injection, labs, and PrEP follow up appointment scheduled for 10/12 - Call with any issues or questions  Dailin Sosnowski L. Savino Whisenant, PharmD, BCIDP, AAHIVP, CPP Clinical Pharmacist Practitioner Infectious Diseases Clinical Pharmacist [facility]

## 2020-10-17 MED ORDER — CABOTEGRAVIR ER 600 MG/3ML IM SUER
600.0000 mg | Freq: Once | INTRAMUSCULAR | Status: AC
  Administered 2020-10-17: 600 mg via INTRAMUSCULAR

## 2020-10-18 LAB — HIV-1 RNA QUANT-NO REFLEX-BLD
HIV 1 RNA Quant: NOT DETECTED Copies/mL
HIV-1 RNA Quant, Log: NOT DETECTED Log cps/mL

## 2020-10-25 ENCOUNTER — Telehealth: Payer: Self-pay

## 2020-10-25 NOTE — Telephone Encounter (Signed)
Called patient to see if he was interested in receiving the [facility] vaccine, no answer. Left HIPAA compliant voicemail requesting callback.   Sandie Ano, RN

## 2020-12-18 ENCOUNTER — Telehealth: Payer: Self-pay

## 2020-12-18 NOTE — Telephone Encounter (Signed)
[facility] Patient Advocate Encounter  Patient's medication (Apretude) have been couriered to [facility] from [employer] and will be administered on patient next office visit on 12/19/20.  [employer] Patient Assistance  Clearance Coots , CPhT Specialty Pharmacy Patient [facility] for Infectious Disease Phone: [phone] Fax:  [phone]

## 2020-12-19 ENCOUNTER — Other Ambulatory Visit: Payer: Self-pay

## 2020-12-19 ENCOUNTER — Ambulatory Visit (INDEPENDENT_AMBULATORY_CARE_PROVIDER_SITE_OTHER): Payer: Self-pay | Admitting: Pharmacist

## 2020-12-19 DIAGNOSIS — Z79899 Other long term (current) drug therapy: Secondary | ICD-10-CM

## 2020-12-19 DIAGNOSIS — Z23 Encounter for immunization: Secondary | ICD-10-CM

## 2020-12-19 MED ORDER — CABOTEGRAVIR ER 600 MG/3ML IM SUER
600.0000 mg | Freq: Once | INTRAMUSCULAR | Status: AC
  Administered 2020-12-19: 600 mg via INTRAMUSCULAR

## 2020-12-19 NOTE — Progress Notes (Signed)
HPI: Roy Wheeler is a 30 y.o. male who presents to the [facility] pharmacy clinic for Apretude administration and HIV PrEP follow up.  There are no problems to display for this patient.   Patient's Medications  New Prescriptions   No medications on file  Previous Medications   BENZONATATE (TESSALON) 100 MG CAPSULE    Take 1 capsule (100 mg total) by mouth every 8 (eight) hours.   DICYCLOMINE (BENTYL) 20 MG TABLET    Take 1 tablet (20 mg total) by mouth 2 (two) times daily.   METRONIDAZOLE (FLAGYL) 500 MG TABLET    Take 1 tablet (500 mg total) by mouth 2 (two) times daily.   MULTIPLE VITAMIN (MULTIVITAMIN) CAPSULE    Take 1 capsule by mouth daily.   ONDANSETRON (ZOFRAN ODT) 4 MG DISINTEGRATING TABLET    Take 1 tablet (4 mg total) by mouth every 8 (eight) hours as needed for nausea or vomiting.   TRAMADOL (ULTRAM) 50 MG TABLET    Take 1 tablet (50 mg total) by mouth every 6 (six) hours as needed.  Modified Medications   No medications on file  Discontinued Medications   No medications on file    Allergies: No Known Allergies  Past Medical History: No past medical history on file.  Social History: Social History   Socioeconomic History   Marital status: Single    Spouse name: Not on file   Number of children: Not on file   Years of education: Not on file   Highest education level: Not on file  Occupational History   Not on file  Tobacco Use   Smoking status: Never   Smokeless tobacco: Never  Substance and Sexual Activity   Alcohol use: Yes    Comment: rarely   Drug use: No   Sexual activity: Not on file  Other Topics Concern   Not on file  Social History Narrative   Not on file   Social Determinants of Health   Financial Resource Strain: Not on file  Food Insecurity: Not on file  Transportation Needs: Not on file  Physical Activity: Not on file  Stress: Not on file  Social Connections: Not on file    Labs: Lab Results  Component Value Date   HIV1RNAQUANT  Not Detected 10/16/2020   HIV1RNAQUANT Not Detected 08/21/2020   HIV1RNAQUANT Not Detected 06/27/2020    RPR and STI Lab Results  Component Value Date   LABRPR REACTIVE (A) 08/21/2020   LABRPR REACTIVE (A) 03/08/2020   LABRPR NON-REACTIVE 04/15/2019   LABRPR NON-REACTIVE 10/18/2018   LABRPR NON-REACTIVE 05/10/2018   RPRTITER 1:2 (H) 08/21/2020   RPRTITER 1:32 (H) 03/08/2020   RPRTITER 1:8 06/11/2015    STI Results GC CT  07/16/2016 NOT DETECTED NOT DETECTED  01/28/2016 NOT DETECTED NOT DETECTED  06/11/2015 NOT DETECTED NOT DETECTED    Hepatitis B Lab Results  Component Value Date   HEPBSAB POS (A) 06/11/2015   HEPBSAG NEGATIVE 05/29/2015   HEPBCAB NON REACTIVE 06/11/2015   Hepatitis C Lab Results  Component Value Date   HEPCAB NON-REACTIVE 08/21/2020   Hepatitis A No results found for: HAV Lipids: Lab Results  Component Value Date   CHOL 178 06/17/2017   TRIG 58 06/17/2017   HDL 46 06/17/2017   CHOLHDL 3.9 06/17/2017   VLDL 9 07/16/2016   LDLCALC 118 (H) 06/17/2017    TARGET DATE: The 9th of the month  Assessment: Cosby presents today for their Apretude injection and to follow up for HIV  PrEP. Initial/past injection was tolerated well without issues. No problems with systemic side effects of injection. No signs or symptoms of anything today. Screened patient for acute HIV symptoms such as fatigue, muscle aches, rash, sore throat, lymphadenopathy, headache, night sweats, nausea/vomiting/diarrhea, and fever. Patient denies any symptoms. No new partners since last injection. Counseled that condoms are still advised and encouraged. Rapid HIV antibody blood test was drawn immediately prior to injection and was negative. Patient also had HIV RNA drawn today.   Administered cabotegravir 600mg /38mL in right upper outer quadrant of the gluteal muscle. Monitored patient for 10 minutes after injection. Injection was tolerated well without issue.  Last STI screening was  08/23/20 and was negative. No need for further kidney monitoring with Apretude. Will see him back in 2 months for injection, labs, and HIV PrEP follow up.  Plan: - Apretude injection administered - HIV RNA today - Next injection, labs, and PrEP follow up appointment scheduled for 12/13 - Call with any issues or questions  Kenedee Molesky L. Aerin Delany, PharmD, BCIDP, AAHIVP, CPP Clinical Pharmacist Practitioner Infectious Diseases Clinical Pharmacist [facility]

## 2020-12-21 LAB — HIV-1 RNA QUANT-NO REFLEX-BLD
HIV 1 RNA Quant: NOT DETECTED Copies/mL
HIV-1 RNA Quant, Log: NOT DETECTED Log cps/mL

## 2021-02-19 ENCOUNTER — Other Ambulatory Visit: Payer: Self-pay

## 2021-02-19 ENCOUNTER — Ambulatory Visit (INDEPENDENT_AMBULATORY_CARE_PROVIDER_SITE_OTHER): Payer: Self-pay | Admitting: Pharmacist

## 2021-02-19 DIAGNOSIS — Z79899 Other long term (current) drug therapy: Secondary | ICD-10-CM

## 2021-02-19 DIAGNOSIS — Z113 Encounter for screening for infections with a predominantly sexual mode of transmission: Secondary | ICD-10-CM

## 2021-02-19 LAB — CYTOLOGY, (ORAL, ANAL, URETHRAL) ANCILLARY ONLY
Chlamydia: NEGATIVE
Comment: NEGATIVE
Comment: NORMAL
Neisseria Gonorrhea: NEGATIVE

## 2021-02-19 LAB — URINE CYTOLOGY ANCILLARY ONLY
Chlamydia: NEGATIVE
Comment: NEGATIVE
Comment: NORMAL
Neisseria Gonorrhea: NEGATIVE

## 2021-02-19 MED ORDER — CABOTEGRAVIR ER 600 MG/3ML IM SUER
600.0000 mg | Freq: Once | INTRAMUSCULAR | Status: AC
Start: 2021-02-19 — End: 2021-02-19
  Administered 2021-02-19: 600 mg via INTRAMUSCULAR

## 2021-02-19 NOTE — Progress Notes (Signed)
HPI: Roy Wheeler is a 30 y.o. male who presents to the [facility] pharmacy clinic for Apretude administration and HIV PrEP follow up.  There are no problems to display for this patient.   Patient's Medications  New Prescriptions   No medications on file  Previous Medications   BENZONATATE (TESSALON) 100 MG CAPSULE    Take 1 capsule (100 mg total) by mouth every 8 (eight) hours.   DICYCLOMINE (BENTYL) 20 MG TABLET    Take 1 tablet (20 mg total) by mouth 2 (two) times daily.   METRONIDAZOLE (FLAGYL) 500 MG TABLET    Take 1 tablet (500 mg total) by mouth 2 (two) times daily.   MULTIPLE VITAMIN (MULTIVITAMIN) CAPSULE    Take 1 capsule by mouth daily.   ONDANSETRON (ZOFRAN ODT) 4 MG DISINTEGRATING TABLET    Take 1 tablet (4 mg total) by mouth every 8 (eight) hours as needed for nausea or vomiting.   TRAMADOL (ULTRAM) 50 MG TABLET    Take 1 tablet (50 mg total) by mouth every 6 (six) hours as needed.  Modified Medications   No medications on file  Discontinued Medications   No medications on file    Allergies: No Known Allergies  Past Medical History: No past medical history on file.  Social History: Social History   Socioeconomic History   Marital status: Single    Spouse name: Not on file   Number of children: Not on file   Years of education: Not on file   Highest education level: Not on file  Occupational History   Not on file  Tobacco Use   Smoking status: Never   Smokeless tobacco: Never  Substance and Sexual Activity   Alcohol use: Yes    Comment: rarely   Drug use: No   Sexual activity: Not on file  Other Topics Concern   Not on file  Social History Narrative   Not on file   Social Determinants of Health   Financial Resource Strain: Not on file  Food Insecurity: Not on file  Transportation Needs: Not on file  Physical Activity: Not on file  Stress: Not on file  Social Connections: Not on file    Labs: Lab Results  Component Value Date   HIV1RNAQUANT  Not Detected 12/19/2020   HIV1RNAQUANT Not Detected 10/16/2020   HIV1RNAQUANT Not Detected 08/21/2020    RPR and STI Lab Results  Component Value Date   LABRPR REACTIVE (A) 08/21/2020   LABRPR REACTIVE (A) 03/08/2020   LABRPR NON-REACTIVE 04/15/2019   LABRPR NON-REACTIVE 10/18/2018   LABRPR NON-REACTIVE 05/10/2018   RPRTITER 1:2 (H) 08/21/2020   RPRTITER 1:32 (H) 03/08/2020   RPRTITER 1:8 06/11/2015    STI Results GC CT  07/16/2016 NOT DETECTED NOT DETECTED  01/28/2016 NOT DETECTED NOT DETECTED  06/11/2015 NOT DETECTED NOT DETECTED    Hepatitis B Lab Results  Component Value Date   HEPBSAB POS (A) 06/11/2015   HEPBSAG NEGATIVE 05/29/2015   HEPBCAB NON REACTIVE 06/11/2015   Hepatitis C Lab Results  Component Value Date   HEPCAB NON-REACTIVE 08/21/2020   Hepatitis A No results found for: HAV Lipids: Lab Results  Component Value Date   CHOL 178 06/17/2017   TRIG 58 06/17/2017   HDL 46 06/17/2017   CHOLHDL 3.9 06/17/2017   VLDL 9 07/16/2016   LDLCALC 118 (H) 06/17/2017    TARGET DATE: The 9th of the month  Assessment: [name] presents today for their Apretude injection and to follow up for HIV  PrEP. Past injection was tolerated well without issues. No problems with systemic side effects of injection.  No signs or symptoms of anything today. Screened patient for acute HIV symptoms such as fatigue, muscle aches, rash, sore throat, lymphadenopathy, headache, night sweats, nausea/vomiting/diarrhea, and fever. Patient denies any symptoms. No new partners since last injection. Counseled that condoms are still advised and encouraged. Rapid HIV antibody blood test was drawn immediately prior to injection and was negative. Patient also had HIV RNA drawn today.   Administered cabotegravir 600mg /47mL in right upper outer quadrant of the gluteal muscle. Monitored patient for 10 minutes after injection. Injection was tolerated well without issue.  Last STI screening was 08/23/20 and  was negative. No need for further kidney monitoring with Apretude. Will see him back in 2 months for injection, labs, and HIV PrEP follow up.  Plan: - Apretude injection administered - Next injection, labs, and PrEP follow up appointment scheduled for 04/22/21 - Call with any issues or questions  Inanna Telford L. Indica Marcott, PharmD, BCIDP, AAHIVP, CPP Clinical Pharmacist Practitioner Infectious Diseases Clinical Pharmacist [facility]

## 2021-02-21 LAB — FLUORESCENT TREPONEMAL AB(FTA)-IGG-BLD: Fluorescent Treponemal ABS: REACTIVE — AB

## 2021-02-21 LAB — RPR: RPR Ser Ql: REACTIVE — AB

## 2021-02-21 LAB — HIV-1 RNA QUANT-NO REFLEX-BLD
HIV 1 RNA Quant: NOT DETECTED Copies/mL
HIV-1 RNA Quant, Log: NOT DETECTED Log cps/mL

## 2021-02-21 LAB — RPR TITER: RPR Titer: 1:2 {titer} — ABNORMAL HIGH

## 2021-04-15 ENCOUNTER — Telehealth: Payer: Self-pay

## 2021-04-15 NOTE — Telephone Encounter (Signed)
[facility] Patient Advocate Encounter  Patient's medication (Apretude) have been couriered to [facility] from [facility] and will be administered on patient next office visit on 04/22/21.  Clearance Coots , CPhT Specialty Pharmacy Patient [facility] for Infectious Disease Phone: [phone] Fax:  [phone]

## 2021-04-22 ENCOUNTER — Ambulatory Visit: Payer: Self-pay | Admitting: Pharmacist

## 2021-04-22 ENCOUNTER — Other Ambulatory Visit: Payer: Self-pay

## 2021-04-24 ENCOUNTER — Other Ambulatory Visit: Payer: Self-pay

## 2021-04-24 ENCOUNTER — Ambulatory Visit (INDEPENDENT_AMBULATORY_CARE_PROVIDER_SITE_OTHER): Payer: Self-pay | Admitting: Pharmacist

## 2021-04-24 DIAGNOSIS — Z79899 Other long term (current) drug therapy: Secondary | ICD-10-CM

## 2021-04-24 MED ORDER — APRETUDE 600 MG/3ML IM SUER: 600.0000 mg | INTRAMUSCULAR | 0 refills | Status: DC

## 2021-04-24 NOTE — Progress Notes (Signed)
HPI: Roy Wheeler is a 31 y.o. male who presents to the [facility] pharmacy clinic for Apretude administration and HIV PrEP follow up.  There are no problems to display for this patient.   Patient's Medications  New Prescriptions   No medications on file  Previous Medications   BENZONATATE (TESSALON) 100 MG CAPSULE    Take 1 capsule (100 mg total) by mouth every 8 (eight) hours.   DICYCLOMINE (BENTYL) 20 MG TABLET    Take 1 tablet (20 mg total) by mouth 2 (two) times daily.   METRONIDAZOLE (FLAGYL) 500 MG TABLET    Take 1 tablet (500 mg total) by mouth 2 (two) times daily.   MULTIPLE VITAMIN (MULTIVITAMIN) CAPSULE    Take 1 capsule by mouth daily.   ONDANSETRON (ZOFRAN ODT) 4 MG DISINTEGRATING TABLET    Take 1 tablet (4 mg total) by mouth every 8 (eight) hours as needed for nausea or vomiting.   TRAMADOL (ULTRAM) 50 MG TABLET    Take 1 tablet (50 mg total) by mouth every 6 (six) hours as needed.  Modified Medications   No medications on file  Discontinued Medications   No medications on file    Allergies: No Known Allergies  Past Medical History: No past medical history on file.  Social History: Social History   Socioeconomic History   Marital status: Single    Spouse name: Not on file   Number of children: Not on file   Years of education: Not on file   Highest education level: Not on file  Occupational History   Not on file  Tobacco Use   Smoking status: Never   Smokeless tobacco: Never  Substance and Sexual Activity   Alcohol use: Yes    Comment: rarely   Drug use: No   Sexual activity: Not on file  Other Topics Concern   Not on file  Social History Narrative   Not on file   Social Determinants of Health   Financial Resource Strain: Not on file  Food Insecurity: Not on file  Transportation Needs: Not on file  Physical Activity: Not on file  Stress: Not on file  Social Connections: Not on file    Labs: Lab Results  Component Value Date   HIV1RNAQUANT  Not Detected 02/19/2021   HIV1RNAQUANT Not Detected 12/19/2020   HIV1RNAQUANT Not Detected 10/16/2020    RPR and STI Lab Results  Component Value Date   LABRPR REACTIVE (A) 02/19/2021   LABRPR REACTIVE (A) 08/21/2020   LABRPR REACTIVE (A) 03/08/2020   LABRPR NON-REACTIVE 04/15/2019   LABRPR NON-REACTIVE 10/18/2018   RPRTITER 1:2 (H) 02/19/2021   RPRTITER 1:2 (H) 08/21/2020   RPRTITER 1:32 (H) 03/08/2020   RPRTITER 1:8 06/11/2015    STI Results GC GC CT CT  02/19/2021 Negative - Negative -  02/19/2021 Negative - Negative -  07/16/2016 - NOT DETECTED - NOT DETECTED  01/28/2016 - NOT DETECTED - NOT DETECTED  06/11/2015 - NOT DETECTED - NOT DETECTED    Hepatitis B Lab Results  Component Value Date   HEPBSAB POS (A) 06/11/2015   HEPBSAG NEGATIVE 05/29/2015   HEPBCAB NON REACTIVE 06/11/2015   Hepatitis C Lab Results  Component Value Date   HEPCAB NON-REACTIVE 08/21/2020   Hepatitis A No results found for: HAV Lipids: Lab Results  Component Value Date   CHOL 178 06/17/2017   TRIG 58 06/17/2017   HDL 46 06/17/2017   CHOLHDL 3.9 06/17/2017   VLDL 9 07/16/2016   LDLCALC 118 (  H) 06/17/2017    TARGET DATE: The 9th of the month  Current PrEP Regimen: Apretude  Assessment: Dj presents today for their maintenance injection of Apretude and to follow up for HIV PrEP. He is excited to start a new job at [employer]; enjoying his first week so far. No new partners since last visit; will check urine cytology and HIV RNA.  Administered cabotegravir 600mg /81mL in right upper outer quadrant of the gluteal muscle. Will make follow up appointments for maintenance injections every 2 months.   Plan: - Maintenance injections scheduled for 4/14 with me - Call with any issues or questions  5/14, PharmD, CPP Clinical Pharmacist Practitioner Infectious Diseases Clinical Pharmacist [facility]

## 2021-04-26 LAB — HIV-1 RNA QUANT-NO REFLEX-BLD
HIV 1 RNA Quant: NOT DETECTED Copies/mL
HIV-1 RNA Quant, Log: NOT DETECTED Log cps/mL

## 2021-04-26 LAB — C. TRACHOMATIS/N. GONORRHOEAE RNA
C. trachomatis RNA, TMA: NOT DETECTED
N. gonorrhoeae RNA, TMA: NOT DETECTED

## 2021-06-05 ENCOUNTER — Telehealth: Payer: Self-pay | Admitting: Pharmacist

## 2021-06-05 NOTE — Telephone Encounter (Signed)
Patient's medication (Apretude) has been delivered to [facility] from [facility] and will be administered at patient's next office visit on 06/21/21. ? ?Clarance Bollard L. Camarion Weier, PharmD ?[facility] Clinical Pharmacist Practitioner ? ?

## 2021-06-21 ENCOUNTER — Ambulatory Visit (INDEPENDENT_AMBULATORY_CARE_PROVIDER_SITE_OTHER): Payer: BC Managed Care – PPO | Admitting: Pharmacist

## 2021-06-21 ENCOUNTER — Other Ambulatory Visit: Payer: Self-pay

## 2021-06-21 DIAGNOSIS — Z79899 Other long term (current) drug therapy: Secondary | ICD-10-CM

## 2021-06-21 MED ORDER — CABOTEGRAVIR ER 600 MG/3ML IM SUER
600.0000 mg | Freq: Once | INTRAMUSCULAR | Status: AC
  Administered 2021-06-21: 600 mg via INTRAMUSCULAR

## 2021-06-22 NOTE — Progress Notes (Signed)
? ?HPI: Roy Wheeler is a 31 y.o. male who presents to the [place] clinic for Apretude administration and HIV PrEP follow up. ? ?There are no problems to display for this patient. ? ? ?Patient's Medications  ?New Prescriptions  ? No medications on file  ?Previous Medications  ? BENZONATATE (TESSALON) 100 MG CAPSULE    Take 1 capsule (100 mg total) by mouth every 8 (eight) hours.  ? CABOTEGRAVIR ER (APRETUDE) 600 MG/3ML INJECTION    Inject 3 mLs (600 mg total) into the muscle every 2 (two) months.  ? DICYCLOMINE (BENTYL) 20 MG TABLET    Take 1 tablet (20 mg total) by mouth 2 (two) times daily.  ? METRONIDAZOLE (FLAGYL) 500 MG TABLET    Take 1 tablet (500 mg total) by mouth 2 (two) times daily.  ? MULTIPLE VITAMIN (MULTIVITAMIN) CAPSULE    Take 1 capsule by mouth daily.  ? ONDANSETRON (ZOFRAN ODT) 4 MG DISINTEGRATING TABLET    Take 1 tablet (4 mg total) by mouth every 8 (eight) hours as needed for nausea or vomiting.  ? TRAMADOL (ULTRAM) 50 MG TABLET    Take 1 tablet (50 mg total) by mouth every 6 (six) hours as needed.  ?Modified Medications  ? No medications on file  ?Discontinued Medications  ? No medications on file  ? ? ?Allergies: ?No Known Allergies ? ?Past Medical History: ?No past medical history on file. ? ?Social History: ?Social History  ? ?Socioeconomic History  ? Marital status: Single  ?  Spouse name: Not on file  ? Number of children: Not on file  ? Years of education: Not on file  ? Highest education level: Not on file  ?Occupational History  ? Not on file  ?Tobacco Use  ? Smoking status: Never  ? Smokeless tobacco: Never  ?Substance and Sexual Activity  ? Alcohol use: Yes  ?  Comment: rarely  ? Drug use: No  ? Sexual activity: Not on file  ?Other Topics Concern  ? Not on file  ?Social History Narrative  ? Not on file  ? ?Social Determinants of Health  ? ?Financial Resource Strain: Not on file  ?Food Insecurity: Not on file  ?Transportation Needs: Not on file  ?Physical Activity: Not on file   ?Stress: Not on file  ?Social Connections: Not on file  ? ? ?Labs: ?Lab Results  ?Component Value Date  ? HIV1RNAQUANT Not Detected 04/24/2021  ? HIV1RNAQUANT Not Detected 02/19/2021  ? HIV1RNAQUANT Not Detected 12/19/2020  ? ? ?RPR and STI ?Lab Results  ?Component Value Date  ? LABRPR REACTIVE (A) 02/19/2021  ? LABRPR REACTIVE (A) 08/21/2020  ? LABRPR REACTIVE (A) 03/08/2020  ? LABRPR NON-REACTIVE 04/15/2019  ? LABRPR NON-REACTIVE 10/18/2018  ? RPRTITER 1:2 (H) 02/19/2021  ? RPRTITER 1:2 (H) 08/21/2020  ? RPRTITER 1:32 (H) 03/08/2020  ? RPRTITER 1:8 06/11/2015  ? ? ?STI Results GC GC CT CT  ?02/19/2021 ? 9:56 AM Negative    Negative     ?02/19/2021 ? 9:17 AM Negative    Negative     ?07/16/2016 ?10:45 AM  NOT DETECTED    NOT DETECTED    ?01/28/2016 ? 8:50 AM  NOT DETECTED    NOT DETECTED    ?06/11/2015 ?10:12 AM  NOT DETECTED    NOT DETECTED    ? ? ?Hepatitis B ?Lab Results  ?Component Value Date  ? HEPBSAB POS (A) 06/11/2015  ? HEPBSAG NEGATIVE 05/29/2015  ? HEPBCAB NON REACTIVE 06/11/2015  ? ?Hepatitis  C ?Lab Results  ?Component Value Date  ? HEPCAB NON-REACTIVE 08/21/2020  ? ?Hepatitis A ?No results found for: HAV ?Lipids: ?Lab Results  ?Component Value Date  ? CHOL 178 06/17/2017  ? TRIG 58 06/17/2017  ? HDL 46 06/17/2017  ? CHOLHDL 3.9 06/17/2017  ? VLDL 9 07/16/2016  ? LDLCALC 118 (H) 06/17/2017  ? ? ?TARGET DATE: ?The 9th of the month ? ?Current PrEP Regimen: ?Apretude ? ?Assessment: ?Roy Wheeler presents today for their Apretude injection and to follow up for HIV PrEP. No issues with past injections. No new partners since last injection, politely declined STI testing today. Rapid HIV blood test was drawn immediately prior to injection and was negative. Patient also had HIV RNA drawn today.  ? ?Administered cabotegravir 600mg /71mL in right upper outer quadrant of the gluteal muscle. Monitored patient for 10 minutes after injection. Injection was tolerated well without issue. Will see him back in 2 months for  injection, labs, and HIV PrEP follow up. ? ?Plan: ?- Apretude injection administered ?- HIV RNA today ?- Next injection, labs, and PrEP follow up appointment scheduled for 6/13 with [facility] ?- Call with any issues or questions ? ?Vance Peper, PharmD ?PGY1 Pharmacy Resident ?06/22/2021 8:35 AM  ?

## 2021-06-24 LAB — URINE CYTOLOGY ANCILLARY ONLY
Chlamydia: NEGATIVE
Comment: NEGATIVE
Comment: NORMAL
Neisseria Gonorrhea: NEGATIVE

## 2021-06-27 LAB — HIV-1 RNA QUANT-NO REFLEX-BLD
HIV 1 RNA Quant: NOT DETECTED {copies}/mL
HIV-1 RNA Quant, Log: NOT DETECTED {Log_copies}/mL

## 2021-08-08 ENCOUNTER — Telehealth: Payer: Self-pay | Admitting: Pharmacist

## 2021-08-08 NOTE — Telephone Encounter (Addendum)
Patient's medication (Apretude) has been delivered to [facility] from [facility] and will be administered at patient's next office visit on 08/20/21.   Myria Steenbergen L. Kelso Bibby, PharmD [facility] Clinical Pharmacist Practitioner

## 2021-08-19 ENCOUNTER — Telehealth: Payer: Self-pay

## 2021-08-19 ENCOUNTER — Other Ambulatory Visit (HOSPITAL_COMMUNITY): Payer: Self-pay

## 2021-08-19 NOTE — Telephone Encounter (Addendum)
[facility] Patient Advocate Encounter   Received notification from [facility] that prior authorization for APRETUDE is required.   PA submitted on 08/19/21 Key  [id number] Status is pending    [facility] Clinic will continue to follow.   Clearance Coots, CPhT Specialty Pharmacy Patient [facility] for Infectious Disease Phone: [phone] Fax:  [phone]

## 2021-08-20 ENCOUNTER — Other Ambulatory Visit (HOSPITAL_COMMUNITY): Payer: Self-pay

## 2021-08-20 ENCOUNTER — Ambulatory Visit (INDEPENDENT_AMBULATORY_CARE_PROVIDER_SITE_OTHER): Payer: BC Managed Care – PPO | Admitting: Pharmacist

## 2021-08-20 ENCOUNTER — Other Ambulatory Visit: Payer: BC Managed Care – PPO

## 2021-08-20 ENCOUNTER — Other Ambulatory Visit: Payer: Self-pay

## 2021-08-20 ENCOUNTER — Other Ambulatory Visit (HOSPITAL_COMMUNITY)
Admission: RE | Admit: 2021-08-20 | Discharge: 2021-08-20 | Disposition: A | Discharge status: Home / Self Care | Payer: BC Managed Care – PPO | Source: Ambulatory Visit | Attending: Infectious Disease | Admitting: Infectious Disease

## 2021-08-20 DIAGNOSIS — Z113 Encounter for screening for infections with a predominantly sexual mode of transmission: Secondary | ICD-10-CM

## 2021-08-20 DIAGNOSIS — Z79899 Other long term (current) drug therapy: Secondary | ICD-10-CM | POA: Diagnosis not present

## 2021-08-20 MED ORDER — CABOTEGRAVIR ER 600 MG/3ML IM SUER
600.0000 mg | Freq: Once | INTRAMUSCULAR | Status: AC
  Administered 2021-08-20: 600 mg via INTRAMUSCULAR

## 2021-08-20 NOTE — Progress Notes (Unsigned)
HPI: Roy Wheeler is a 31 y.o. male who presents to the [facility] pharmacy clinic for Apretude administration and HIV PrEP follow up.  There are no problems to display for this patient.   Patient's Medications  New Prescriptions   No medications on file  Previous Medications   BENZONATATE (TESSALON) 100 MG CAPSULE    Take 1 capsule (100 mg total) by mouth every 8 (eight) hours.   CABOTEGRAVIR ER (APRETUDE) 600 MG/3ML INJECTION    Inject 3 mLs (600 mg total) into the muscle every 2 (two) months.   DICYCLOMINE (BENTYL) 20 MG TABLET    Take 1 tablet (20 mg total) by mouth 2 (two) times daily.   METRONIDAZOLE (FLAGYL) 500 MG TABLET    Take 1 tablet (500 mg total) by mouth 2 (two) times daily.   MULTIPLE VITAMIN (MULTIVITAMIN) CAPSULE    Take 1 capsule by mouth daily.   ONDANSETRON (ZOFRAN ODT) 4 MG DISINTEGRATING TABLET    Take 1 tablet (4 mg total) by mouth every 8 (eight) hours as needed for nausea or vomiting.   TRAMADOL (ULTRAM) 50 MG TABLET    Take 1 tablet (50 mg total) by mouth every 6 (six) hours as needed.  Modified Medications   No medications on file  Discontinued Medications   No medications on file    Allergies: No Known Allergies  Past Medical History: No past medical history on file.  Social History: Social History   Socioeconomic History   Marital status: Single    Spouse name: Not on file   Number of children: Not on file   Years of education: Not on file   Highest education level: Not on file  Occupational History   Not on file  Tobacco Use   Smoking status: Never   Smokeless tobacco: Never  Substance and Sexual Activity   Alcohol use: Yes    Comment: rarely   Drug use: No   Sexual activity: Not on file  Other Topics Concern   Not on file  Social History Narrative   Not on file   Social Determinants of Health   Financial Resource Strain: Not on file  Food Insecurity: Not on file  Transportation Needs: Not on file  Physical Activity: Not on file   Stress: Not on file  Social Connections: Not on file    Labs: Lab Results  Component Value Date   HIV1RNAQUANT NOT DETECTED 06/21/2021   HIV1RNAQUANT Not Detected 04/24/2021   HIV1RNAQUANT Not Detected 02/19/2021    RPR and STI Lab Results  Component Value Date   LABRPR REACTIVE (A) 02/19/2021   LABRPR REACTIVE (A) 08/21/2020   LABRPR REACTIVE (A) 03/08/2020   LABRPR NON-REACTIVE 04/15/2019   LABRPR NON-REACTIVE 10/18/2018   RPRTITER 1:2 (H) 02/19/2021   RPRTITER 1:2 (H) 08/21/2020   RPRTITER 1:32 (H) 03/08/2020   RPRTITER 1:8 06/11/2015    STI Results GC GC CT CT  06/21/2021  4:05 PM Negative   Negative    02/19/2021  9:56 AM Negative   Negative    02/19/2021  9:17 AM Negative   Negative    07/16/2016 10:45 AM  NOT DETECTED   NOT DETECTED   01/28/2016  8:50 AM  NOT DETECTED   NOT DETECTED   06/11/2015 10:12 AM  NOT DETECTED   NOT DETECTED     Hepatitis B Lab Results  Component Value Date   HEPBSAB POS (A) 06/11/2015   HEPBSAG NEGATIVE 05/29/2015   HEPBCAB NON REACTIVE 06/11/2015  Hepatitis C Lab Results  Component Value Date   HEPCAB NON-REACTIVE 08/21/2020   Hepatitis A No results found for: "HAV" Lipids: Lab Results  Component Value Date   CHOL 178 06/17/2017   TRIG 58 06/17/2017   HDL 46 06/17/2017   CHOLHDL 3.9 06/17/2017   VLDL 9 07/16/2016   LDLCALC 118 (H) 06/17/2017    TARGET DATE: The 9th of the month  Current PrEP Regimen: Apretude  Assessment: Roy Wheeler presents today for their Apretude injection and to follow up for HIV PrEP. No issues with past injections. No known exposures to any STIs and no signs or symptoms of any STIs today. Last STI screening was 06/21/21 and was negative. He would like urine/oral/rectal cytologies today. Screened patient for acute HIV symptoms such as fatigue, muscle aches, rash, sore throat, lymphadenopathy, headache, night sweats, nausea/vomiting/diarrhea, and fever. Patient denies any symptoms. No new  partners since last injection. Rapid HIV blood test was drawn immediately prior to injection and was negative. Patient also had HIV RNA drawn today.   Administered cabotegravir 600mg /46mL in right upper outer quadrant of the gluteal muscle. Will make follow up appointments for maintenance injections every 2 months.    Plan: - Check HIV RNA, RPR, urine/oral/rectal cytology, and Hepatitis A antibody - Maintenance injections scheduled for 10/17/21 @4 :00 with 12/17/21 - Call with any issues or questions  , PharmD PGY2 Infectious Diseases Pharmacy Resident

## 2021-08-22 ENCOUNTER — Other Ambulatory Visit: Payer: Self-pay

## 2021-08-22 ENCOUNTER — Other Ambulatory Visit (HOSPITAL_COMMUNITY): Payer: Self-pay

## 2021-08-22 ENCOUNTER — Ambulatory Visit (INDEPENDENT_AMBULATORY_CARE_PROVIDER_SITE_OTHER): Payer: BC Managed Care – PPO | Admitting: Pharmacist

## 2021-08-22 DIAGNOSIS — Z113 Encounter for screening for infections with a predominantly sexual mode of transmission: Secondary | ICD-10-CM

## 2021-08-22 DIAGNOSIS — A549 Gonococcal infection, unspecified: Secondary | ICD-10-CM | POA: Diagnosis not present

## 2021-08-22 LAB — URINE CYTOLOGY ANCILLARY ONLY
Chlamydia: NEGATIVE
Comment: NEGATIVE
Comment: NORMAL
Neisseria Gonorrhea: NEGATIVE

## 2021-08-22 LAB — CYTOLOGY, (ORAL, ANAL, URETHRAL) ANCILLARY ONLY
Chlamydia: NEGATIVE
Chlamydia: NEGATIVE
Comment: NEGATIVE
Comment: NEGATIVE
Comment: NORMAL
Comment: NORMAL
Neisseria Gonorrhea: NEGATIVE
Neisseria Gonorrhea: POSITIVE — AB

## 2021-08-22 MED ORDER — CEFTRIAXONE SODIUM 500 MG IJ SOLR
500.0000 mg | Freq: Once | INTRAMUSCULAR | Status: AC
  Administered 2021-08-22: 500 mg via INTRAMUSCULAR

## 2021-08-22 NOTE — Progress Notes (Addendum)
Date:  08/22/2021   HPI: Roy Wheeler is a 31 y.o. male who presents to the [facility] pharmacy clinic for STI treatment.  Insured   [x]    Uninsured  []    There are no problems to display for this patient.   Patient's Medications  New Prescriptions   No medications on file  Previous Medications   BENZONATATE (TESSALON) 100 MG CAPSULE    Take 1 capsule (100 mg total) by mouth every 8 (eight) hours.   CABOTEGRAVIR ER (APRETUDE) 600 MG/3ML INJECTION    Inject 3 mLs (600 mg total) into the muscle every 2 (two) months.   DICYCLOMINE (BENTYL) 20 MG TABLET    Take 1 tablet (20 mg total) by mouth 2 (two) times daily.   METRONIDAZOLE (FLAGYL) 500 MG TABLET    Take 1 tablet (500 mg total) by mouth 2 (two) times daily.   MULTIPLE VITAMIN (MULTIVITAMIN) CAPSULE    Take 1 capsule by mouth daily.   ONDANSETRON (ZOFRAN ODT) 4 MG DISINTEGRATING TABLET    Take 1 tablet (4 mg total) by mouth every 8 (eight) hours as needed for nausea or vomiting.   TRAMADOL (ULTRAM) 50 MG TABLET    Take 1 tablet (50 mg total) by mouth every 6 (six) hours as needed.  Modified Medications   No medications on file  Discontinued Medications   No medications on file    Allergies: No Known Allergies  Past Medical History: No past medical history on file.  Social History: Social History   Socioeconomic History   Marital status: Single    Spouse name: Not on file   Number of children: Not on file   Years of education: Not on file   Highest education level: Not on file  Occupational History   Not on file  Tobacco Use   Smoking status: Never   Smokeless tobacco: Never  Substance and Sexual Activity   Alcohol use: Yes    Comment: rarely   Drug use: No   Sexual activity: Not on file  Other Topics Concern   Not on file  Social History Narrative   Not on file   Social Determinants of Health   Financial Resource Strain: Not on file  Food Insecurity: Not on file  Transportation Needs: Not on file  Physical  Activity: Not on file  Stress: Not on file  Social Connections: Not on file        No data to display          Labs:  SCr: Lab Results  Component Value Date   CREATININE 1.00 08/21/2020   CREATININE 0.81 03/08/2020   CREATININE 0.95 09/20/2019   CREATININE 0.95 08/23/2019   CREATININE 0.92 04/15/2019   HIV Lab Results  Component Value Date   HIV NON-REACTIVE 08/21/2020   HIV NON-REACTIVE 06/27/2020   HIV NON-REACTIVE 04/30/2020   HIV NON-REACTIVE 03/08/2020   HIV NON-REACTIVE 01/12/2020   Hepatitis B Lab Results  Component Value Date   HEPBSAB POS (A) 06/11/2015   HEPBSAG NEGATIVE 05/29/2015   HEPBCAB NON REACTIVE 06/11/2015   Hepatitis C Lab Results  Component Value Date   HEPCAB NON-REACTIVE 08/21/2020   Hepatitis A Lab Results  Component Value Date   HAV REACTIVE (A) 08/20/2021   RPR and STI Lab Results  Component Value Date   LABRPR REACTIVE (A) 08/20/2021   LABRPR REACTIVE (A) 02/19/2021   LABRPR REACTIVE (A) 08/21/2020   LABRPR REACTIVE (A) 03/08/2020   LABRPR NON-REACTIVE 04/15/2019   RPRTITER  1:1 (H) 08/20/2021   RPRTITER 1:2 (H) 02/19/2021   RPRTITER 1:2 (H) 08/21/2020   RPRTITER 1:32 (H) 03/08/2020   RPRTITER 1:8 06/11/2015    STI Results GC GC CT CT  08/20/2021  4:35 PM Negative    Positive   Negative    Negative    08/20/2021  3:48 PM Negative   Negative    06/21/2021  4:05 PM Negative   Negative    02/19/2021  9:56 AM Negative   Negative    02/19/2021  9:17 AM Negative   Negative    07/16/2016 10:45 AM  NOT DETECTED   NOT DETECTED   01/28/2016  8:50 AM  NOT DETECTED   NOT DETECTED   06/11/2015 10:12 AM  NOT DETECTED   NOT DETECTED     Assessment: Roy Wheeler returns to clinic today for pharyngeal gonorrhea treatment. Administered ceftriaxone 500mg  x 1. Scheduled follow-up in 3 weeks to assess test of cure. Also reviewed his other lab results and discussed that his hepatitis A result demonstrated immunity to hepatitis  A.  Plan: Administered ceftriaxone 500mg  x 1 Follow-up on 7/7 at 3:15pm for test of cure oral cytology   , PharmD, CPP Clinical Pharmacist Practitioner Infectious Diseases Clinical Pharmacist [facility] 08/22/2021, 4:18 PM       I have reviewed the Infectious Disease Clinical Pharmacist's note above. I agree with the assessment and plan as outlined in [facility] note.  A coding query was generated due to the fact that gonorrhea was listed as his diagnosis but he tested negative.  He was actually treated presumptively for gonococcal infection was a fairly routine and actually standard of care.  I have changed his coding to STI screening though I do not see that this should be necessary.

## 2021-08-23 ENCOUNTER — Other Ambulatory Visit: Payer: BC Managed Care – PPO

## 2021-08-23 LAB — RPR TITER: RPR Titer: 1:1 {titer} — ABNORMAL HIGH

## 2021-08-23 LAB — HEPATITIS A ANTIBODY, TOTAL: Hepatitis A AB,Total: REACTIVE — AB

## 2021-08-23 LAB — FLUORESCENT TREPONEMAL AB(FTA)-IGG-BLD: Fluorescent Treponemal ABS: REACTIVE — AB

## 2021-08-23 LAB — RPR: RPR Ser Ql: REACTIVE — AB

## 2021-08-23 LAB — HIV-1 RNA QUANT-NO REFLEX-BLD
HIV 1 RNA Quant: NOT DETECTED Copies/mL
HIV-1 RNA Quant, Log: NOT DETECTED Log cps/mL

## 2021-09-13 ENCOUNTER — Ambulatory Visit (INDEPENDENT_AMBULATORY_CARE_PROVIDER_SITE_OTHER): Payer: BC Managed Care – PPO | Admitting: Pharmacist

## 2021-09-13 ENCOUNTER — Other Ambulatory Visit (HOSPITAL_COMMUNITY)
Admission: RE | Admit: 2021-09-13 | Discharge: 2021-09-13 | Disposition: A | Discharge status: Home / Self Care | Payer: BC Managed Care – PPO | Source: Ambulatory Visit | Attending: Infectious Disease | Admitting: Infectious Disease

## 2021-09-13 ENCOUNTER — Other Ambulatory Visit: Payer: Self-pay

## 2021-09-13 DIAGNOSIS — A549 Gonococcal infection, unspecified: Secondary | ICD-10-CM | POA: Insufficient documentation

## 2021-09-13 DIAGNOSIS — Z113 Encounter for screening for infections with a predominantly sexual mode of transmission: Secondary | ICD-10-CM | POA: Diagnosis present

## 2021-09-13 NOTE — Patient Instructions (Signed)
Donna's Email: [email or web address]

## 2021-09-13 NOTE — Progress Notes (Signed)
Date:  09/13/2021   HPI: Roy Wheeler is a 31 y.o. male who presents to the [facility] pharmacy clinic for STI follow-up.  Insured   [x]    Uninsured  []    There are no problems to display for this patient.   Patient's Medications  New Prescriptions   No medications on file  Previous Medications   BENZONATATE (TESSALON) 100 MG CAPSULE    Take 1 capsule (100 mg total) by mouth every 8 (eight) hours.   CABOTEGRAVIR ER (APRETUDE) 600 MG/3ML INJECTION    Inject 3 mLs (600 mg total) into the muscle every 2 (two) months.   DICYCLOMINE (BENTYL) 20 MG TABLET    Take 1 tablet (20 mg total) by mouth 2 (two) times daily.   METRONIDAZOLE (FLAGYL) 500 MG TABLET    Take 1 tablet (500 mg total) by mouth 2 (two) times daily.   MULTIPLE VITAMIN (MULTIVITAMIN) CAPSULE    Take 1 capsule by mouth daily.   ONDANSETRON (ZOFRAN ODT) 4 MG DISINTEGRATING TABLET    Take 1 tablet (4 mg total) by mouth every 8 (eight) hours as needed for nausea or vomiting.   TRAMADOL (ULTRAM) 50 MG TABLET    Take 1 tablet (50 mg total) by mouth every 6 (six) hours as needed.  Modified Medications   No medications on file  Discontinued Medications   No medications on file    Allergies: No Known Allergies  Past Medical History: No past medical history on file.  Social History: Social History   Socioeconomic History   Marital status: Single    Spouse name: Not on file   Number of children: Not on file   Years of education: Not on file   Highest education level: Not on file  Occupational History   Not on file  Tobacco Use   Smoking status: Never   Smokeless tobacco: Never  Substance and Sexual Activity   Alcohol use: Yes    Comment: rarely   Drug use: No   Sexual activity: Not on file  Other Topics Concern   Not on file  Social History Narrative   Not on file   Social Determinants of Health   Financial Resource Strain: Not on file  Food Insecurity: Not on file  Transportation Needs: Not on file  Physical  Activity: Not on file  Stress: Not on file  Social Connections: Not on file        No data to display          Labs:  SCr: Lab Results  Component Value Date   CREATININE 1.00 08/21/2020   CREATININE 0.81 03/08/2020   CREATININE 0.95 09/20/2019   CREATININE 0.95 08/23/2019   CREATININE 0.92 04/15/2019   HIV Lab Results  Component Value Date   HIV NON-REACTIVE 08/21/2020   HIV NON-REACTIVE 06/27/2020   HIV NON-REACTIVE 04/30/2020   HIV NON-REACTIVE 03/08/2020   HIV NON-REACTIVE 01/12/2020   Hepatitis B Lab Results  Component Value Date   HEPBSAB POS (A) 06/11/2015   HEPBSAG NEGATIVE 05/29/2015   HEPBCAB NON REACTIVE 06/11/2015   Hepatitis C Lab Results  Component Value Date   HEPCAB NON-REACTIVE 08/21/2020   Hepatitis A Lab Results  Component Value Date   HAV REACTIVE (A) 08/20/2021   RPR and STI Lab Results  Component Value Date   LABRPR REACTIVE (A) 08/20/2021   LABRPR REACTIVE (A) 02/19/2021   LABRPR REACTIVE (A) 08/21/2020   LABRPR REACTIVE (A) 03/08/2020   LABRPR NON-REACTIVE 04/15/2019   RPRTITER  1:1 (H) 08/20/2021   RPRTITER 1:2 (H) 02/19/2021   RPRTITER 1:2 (H) 08/21/2020   RPRTITER 1:32 (H) 03/08/2020   RPRTITER 1:8 06/11/2015    STI Results GC GC CT CT  08/20/2021  4:35 PM Negative    Positive   Negative    Negative    08/20/2021  3:48 PM Negative   Negative    06/21/2021  4:05 PM Negative   Negative    02/19/2021  9:56 AM Negative   Negative    02/19/2021  9:17 AM Negative   Negative    07/16/2016 10:45 AM  NOT DETECTED   NOT DETECTED   01/28/2016  8:50 AM  NOT DETECTED   NOT DETECTED   06/11/2015 10:12 AM  NOT DETECTED   NOT DETECTED     Assessment: Roy Wheeler presents to clinic today for gonorrhea follow-up. He recently tested positive for pharyngeal gonorrhea and received ceftriaxone 500mg  x1 on 6/15. Rechecking oral cytology today for test of cure. Deferred any further testing.  Plan: Check oral cytology  7/15,  PharmD, CPP Clinical Pharmacist Practitioner Infectious Diseases Clinical Pharmacist [facility] 09/13/2021, 3:58 PM

## 2021-09-16 LAB — CYTOLOGY, (ORAL, ANAL, URETHRAL) ANCILLARY ONLY
Chlamydia: NEGATIVE
Comment: NEGATIVE
Comment: NORMAL
Neisseria Gonorrhea: NEGATIVE

## 2021-09-27 ENCOUNTER — Telehealth: Payer: Self-pay

## 2021-09-27 NOTE — Telephone Encounter (Signed)
[facility] Patient Advocate Encounter  J-Code [phone] no pre-cert required.  Ref # [id number]  Patient will have a $40.00 office copay.   Patient is enrolled in [employer] Portal Claim    Clearance Coots, CPhT Specialty Pharmacy Patient [facility] Phone: [phone] Fax:  [phone]

## 2021-10-11 ENCOUNTER — Other Ambulatory Visit: Payer: Self-pay | Admitting: Pharmacist

## 2021-10-11 DIAGNOSIS — Z79899 Other long term (current) drug therapy: Secondary | ICD-10-CM

## 2021-10-17 ENCOUNTER — Ambulatory Visit (INDEPENDENT_AMBULATORY_CARE_PROVIDER_SITE_OTHER): Payer: BC Managed Care – PPO | Admitting: Pharmacist

## 2021-10-17 ENCOUNTER — Other Ambulatory Visit: Payer: BC Managed Care – PPO

## 2021-10-17 ENCOUNTER — Other Ambulatory Visit: Payer: Self-pay

## 2021-10-17 ENCOUNTER — Other Ambulatory Visit (HOSPITAL_COMMUNITY)
Admission: RE | Admit: 2021-10-17 | Discharge: 2021-10-17 | Disposition: A | Discharge status: Home / Self Care | Payer: BC Managed Care – PPO | Source: Ambulatory Visit | Attending: Infectious Disease | Admitting: Infectious Disease

## 2021-10-17 DIAGNOSIS — Z79899 Other long term (current) drug therapy: Secondary | ICD-10-CM | POA: Diagnosis present

## 2021-10-17 MED ORDER — CABOTEGRAVIR ER 600 MG/3ML IM SUER
600.0000 mg | Freq: Once | INTRAMUSCULAR | Status: AC
  Administered 2021-10-17: 600 mg via INTRAMUSCULAR

## 2021-10-17 NOTE — Progress Notes (Signed)
HPI: Roy Wheeler is a 31 y.o. male who presents to the [facility] pharmacy clinic for Apretude administration and HIV PrEP follow up.  There are no problems to display for this patient.   Patient's Medications  New Prescriptions   No medications on file  Previous Medications   BENZONATATE (TESSALON) 100 MG CAPSULE    Take 1 capsule (100 mg total) by mouth every 8 (eight) hours.   CABOTEGRAVIR ER (APRETUDE) 600 MG/3ML INJECTION    Inject 3 mLs (600 mg total) into the muscle every 2 (two) months.   DICYCLOMINE (BENTYL) 20 MG TABLET    Take 1 tablet (20 mg total) by mouth 2 (two) times daily.   METRONIDAZOLE (FLAGYL) 500 MG TABLET    Take 1 tablet (500 mg total) by mouth 2 (two) times daily.   MULTIPLE VITAMIN (MULTIVITAMIN) CAPSULE    Take 1 capsule by mouth daily.   ONDANSETRON (ZOFRAN ODT) 4 MG DISINTEGRATING TABLET    Take 1 tablet (4 mg total) by mouth every 8 (eight) hours as needed for nausea or vomiting.   TRAMADOL (ULTRAM) 50 MG TABLET    Take 1 tablet (50 mg total) by mouth every 6 (six) hours as needed.  Modified Medications   No medications on file  Discontinued Medications   No medications on file    Allergies: No Known Allergies  Past Medical History: No past medical history on file.  Social History: Social History   Socioeconomic History   Marital status: Single    Spouse name: Not on file   Number of children: Not on file   Years of education: Not on file   Highest education level: Not on file  Occupational History   Not on file  Tobacco Use   Smoking status: Never   Smokeless tobacco: Never  Substance and Sexual Activity   Alcohol use: Yes    Comment: rarely   Drug use: No   Sexual activity: Not on file  Other Topics Concern   Not on file  Social History Narrative   Not on file   Social Determinants of Health   Financial Resource Strain: Not on file  Food Insecurity: Not on file  Transportation Needs: Not on file  Physical Activity: Not on file   Stress: Not on file  Social Connections: Not on file    Labs: Lab Results  Component Value Date   HIV1RNAQUANT Not Detected 08/20/2021   HIV1RNAQUANT NOT DETECTED 06/21/2021   HIV1RNAQUANT Not Detected 04/24/2021    RPR and STI Lab Results  Component Value Date   LABRPR REACTIVE (A) 08/20/2021   LABRPR REACTIVE (A) 02/19/2021   LABRPR REACTIVE (A) 08/21/2020   LABRPR REACTIVE (A) 03/08/2020   LABRPR NON-REACTIVE 04/15/2019   RPRTITER 1:1 (H) 08/20/2021   RPRTITER 1:2 (H) 02/19/2021   RPRTITER 1:2 (H) 08/21/2020   RPRTITER 1:32 (H) 03/08/2020   RPRTITER 1:8 06/11/2015    STI Results GC GC CT CT  09/13/2021  3:21 PM Negative   Negative    08/20/2021  4:35 PM Negative    Positive   Negative    Negative    08/20/2021  3:48 PM Negative   Negative    06/21/2021  4:05 PM Negative   Negative    02/19/2021  9:56 AM Negative   Negative    02/19/2021  9:17 AM Negative   Negative    07/16/2016 10:45 AM  NOT DETECTED   NOT DETECTED   01/28/2016  8:50 AM  NOT  DETECTED   NOT DETECTED   06/11/2015 10:12 AM  NOT DETECTED   NOT DETECTED     Hepatitis B Lab Results  Component Value Date   HEPBSAB POS (A) 06/11/2015   HEPBSAG NEGATIVE 05/29/2015   HEPBCAB NON REACTIVE 06/11/2015   Hepatitis C Lab Results  Component Value Date   HEPCAB NON-REACTIVE 08/21/2020   Hepatitis A Lab Results  Component Value Date   HAV REACTIVE (A) 08/20/2021   Lipids: Lab Results  Component Value Date   CHOL 178 06/17/2017   TRIG 58 06/17/2017   HDL 46 06/17/2017   CHOLHDL 3.9 06/17/2017   VLDL 9 07/16/2016   LDLCALC 118 (H) 06/17/2017    TARGET DATE: The 9th of the month  Current PrEP Regimen: Apretude  Assessment: Roy Wheeler presents today for their Apretude injection and to follow up for HIV PrEP. No issues with past injections. No known exposures to any STIs and no signs or symptoms of any STIs today. Last STI screening was 7/7 for pharyngeal gonorrhea test of cure and was  negative. Screened patient for acute HIV symptoms such as fatigue, muscle aches, rash, sore throat, lymphadenopathy, headache, night sweats, nausea/vomiting/diarrhea, and fever. Patient denies any symptoms. No new partners since last injection. Rapid HIV blood test was drawn immediately prior to injection and was negative. Patient also had HIV RNA drawn today along with urine cytology.  Administered cabotegravir 600mg /42mL in right upper outer quadrant of the gluteal muscle. Will make follow up appointments for maintenance injections every 2 months.   Plan: - Maintenance injections scheduled for 10/3 and 12/5 with me  - Check HIV RNA and urine cytology - Call with any issues or questions  14/5, PharmD, CPP, BCIDP Clinical Pharmacist Practitioner Infectious Diseases Clinical Pharmacist [facility]

## 2021-10-18 LAB — URINE CYTOLOGY ANCILLARY ONLY
Chlamydia: NEGATIVE
Comment: NEGATIVE
Comment: NORMAL
Neisseria Gonorrhea: NEGATIVE

## 2021-10-21 LAB — HIV-1 RNA QUANT-NO REFLEX-BLD
HIV 1 RNA Quant: NOT DETECTED Copies/mL
HIV-1 RNA Quant, Log: NOT DETECTED Log cps/mL

## 2021-12-10 ENCOUNTER — Other Ambulatory Visit (HOSPITAL_COMMUNITY)
Admission: RE | Admit: 2021-12-10 | Discharge: 2021-12-10 | Disposition: A | Discharge status: Home / Self Care | Payer: BC Managed Care – PPO | Source: Ambulatory Visit | Attending: Infectious Disease | Admitting: Infectious Disease

## 2021-12-10 ENCOUNTER — Ambulatory Visit (INDEPENDENT_AMBULATORY_CARE_PROVIDER_SITE_OTHER): Payer: BC Managed Care – PPO | Admitting: Pharmacist

## 2021-12-10 ENCOUNTER — Other Ambulatory Visit: Payer: BC Managed Care – PPO

## 2021-12-10 ENCOUNTER — Other Ambulatory Visit: Payer: Self-pay

## 2021-12-10 DIAGNOSIS — Z79899 Other long term (current) drug therapy: Secondary | ICD-10-CM

## 2021-12-10 DIAGNOSIS — Z23 Encounter for immunization: Secondary | ICD-10-CM

## 2021-12-10 MED ORDER — CABOTEGRAVIR ER 600 MG/3ML IM SUER
600.0000 mg | Freq: Once | INTRAMUSCULAR | Status: AC
  Administered 2021-12-10: 600 mg via INTRAMUSCULAR

## 2021-12-10 NOTE — Progress Notes (Signed)
HPI: Roy Wheeler is a 31 y.o. male who presents to the [facility] pharmacy clinic for Apretude administration and HIV PrEP follow up.  There are no problems to display for this patient.   Patient's Medications  New Prescriptions   No medications on file  Previous Medications   BENZONATATE (TESSALON) 100 MG CAPSULE    Take 1 capsule (100 mg total) by mouth every 8 (eight) hours.   CABOTEGRAVIR ER (APRETUDE) 600 MG/3ML INJECTION    Inject 3 mLs (600 mg total) into the muscle every 2 (two) months.   DICYCLOMINE (BENTYL) 20 MG TABLET    Take 1 tablet (20 mg total) by mouth 2 (two) times daily.   METRONIDAZOLE (FLAGYL) 500 MG TABLET    Take 1 tablet (500 mg total) by mouth 2 (two) times daily.   MULTIPLE VITAMIN (MULTIVITAMIN) CAPSULE    Take 1 capsule by mouth daily.   ONDANSETRON (ZOFRAN ODT) 4 MG DISINTEGRATING TABLET    Take 1 tablet (4 mg total) by mouth every 8 (eight) hours as needed for nausea or vomiting.   TRAMADOL (ULTRAM) 50 MG TABLET    Take 1 tablet (50 mg total) by mouth every 6 (six) hours as needed.  Modified Medications   No medications on file  Discontinued Medications   No medications on file    Allergies: No Known Allergies  Past Medical History: No past medical history on file.  Social History: Social History   Socioeconomic History   Marital status: Single    Spouse name: Not on file   Number of children: Not on file   Years of education: Not on file   Highest education level: Not on file  Occupational History   Not on file  Tobacco Use   Smoking status: Never   Smokeless tobacco: Never  Substance and Sexual Activity   Alcohol use: Yes    Comment: rarely   Drug use: No   Sexual activity: Not on file  Other Topics Concern   Not on file  Social History Narrative   Not on file   Social Determinants of Health   Financial Resource Strain: Not on file  Food Insecurity: Not on file  Transportation Needs: Not on file  Physical Activity: Not on file   Stress: Not on file  Social Connections: Not on file    Labs: Lab Results  Component Value Date   HIV1RNAQUANT Not Detected 10/17/2021   HIV1RNAQUANT Not Detected 08/20/2021   HIV1RNAQUANT NOT DETECTED 06/21/2021    RPR and STI Lab Results  Component Value Date   LABRPR REACTIVE (A) 08/20/2021   LABRPR REACTIVE (A) 02/19/2021   LABRPR REACTIVE (A) 08/21/2020   LABRPR REACTIVE (A) 03/08/2020   LABRPR NON-REACTIVE 04/15/2019   RPRTITER 1:1 (H) 08/20/2021   RPRTITER 1:2 (H) 02/19/2021   RPRTITER 1:2 (H) 08/21/2020   RPRTITER 1:32 (H) 03/08/2020   RPRTITER 1:8 06/11/2015    STI Results GC GC CT CT  10/17/2021  3:34 PM Negative   Negative    09/13/2021  3:21 PM Negative   Negative    08/20/2021  4:35 PM Negative    Positive   Negative    Negative    08/20/2021  3:48 PM Negative   Negative    06/21/2021  4:05 PM Negative   Negative    02/19/2021  9:56 AM Negative   Negative    02/19/2021  9:17 AM Negative   Negative    07/16/2016 10:45 AM  NOT DETECTED  NOT DETECTED   01/28/2016  8:50 AM  NOT DETECTED   NOT DETECTED   06/11/2015 10:12 AM  NOT DETECTED   NOT DETECTED     Hepatitis B Lab Results  Component Value Date   HEPBSAB POS (A) 06/11/2015   HEPBSAG NEGATIVE 05/29/2015   HEPBCAB NON REACTIVE 06/11/2015   Hepatitis C Lab Results  Component Value Date   HEPCAB NON-REACTIVE 08/21/2020   Hepatitis A Lab Results  Component Value Date   HAV REACTIVE (A) 08/20/2021   Lipids: Lab Results  Component Value Date   CHOL 178 06/17/2017   TRIG 58 06/17/2017   HDL 46 06/17/2017   CHOLHDL 3.9 06/17/2017   VLDL 9 07/16/2016   LDLCALC 118 (H) 06/17/2017    TARGET DATE: 9th of each month   Assessment: [name] presents today for his Apretude injection and to follow up for HIV PrEP. No issues with past injections. Screened for acute HIV symptoms such as fatigue, muscle aches, rash, sore throat, lymphadenopathy, headache, night sweats, nausea/vomiting/diarrhea,  and fever. Denies any symptoms. No known exposures to any STIs and no signs or symptoms of any STIs today.   Per [employer] guidelines, a rapid HIV test should be drawn prior to Apretude administration. Due to state shortage of rapid HIV tests, this is temporarily unable to be done. Per decision from [facility], we will proceed with Apretude administration at this time without a negative rapid HIV test beforehand. HIV RNA was collected today and is in process.  Administered cabotegravir 600mg /66mL in right upper outer quadrant of the gluteal muscle. Injection was tolerated well without issue. Will see him back in 2 months for injection, labs, and HIV PrEP follow up.  Condoms offered and accepted.  Plan: - Apretude injection administered - Flu vaccine administered  - F/U urine, rectal, and oral cytologies  - HIV RNA today - Next injection, labs, and PrEP follow up appointment scheduled for 02/11/2022  - Call with any issues or questions  Adria Dill, PharmD PGY-2 Infectious Diseases Resident  12/10/2021 4:08 PM

## 2021-12-11 ENCOUNTER — Other Ambulatory Visit (HOSPITAL_COMMUNITY)
Admission: RE | Admit: 2021-12-11 | Discharge: 2021-12-11 | Disposition: A | Discharge status: Home / Self Care | Payer: BC Managed Care – PPO | Source: Ambulatory Visit | Attending: Infectious Disease | Admitting: Infectious Disease

## 2021-12-11 DIAGNOSIS — Z79899 Other long term (current) drug therapy: Secondary | ICD-10-CM | POA: Diagnosis present

## 2021-12-11 NOTE — Addendum Note (Signed)
Addended by: Esmond Plants on: 12/11/2021 09:03 AM   Modules accepted: Orders

## 2021-12-12 LAB — CYTOLOGY, (ORAL, ANAL, URETHRAL) ANCILLARY ONLY
Chlamydia: NEGATIVE
Chlamydia: NEGATIVE
Comment: NEGATIVE
Comment: NORMAL
Comment: NEGATIVE
Comment: NORMAL
Neisseria Gonorrhea: NEGATIVE
Neisseria Gonorrhea: NEGATIVE

## 2021-12-12 LAB — URINE CYTOLOGY ANCILLARY ONLY
Chlamydia: NEGATIVE
Comment: NEGATIVE
Comment: NORMAL
Neisseria Gonorrhea: NEGATIVE

## 2021-12-12 LAB — HIV-1 RNA QUANT-NO REFLEX-BLD
HIV 1 RNA Quant: NOT DETECTED Copies/mL
HIV-1 RNA Quant, Log: NOT DETECTED Log cps/mL

## 2022-02-11 ENCOUNTER — Ambulatory Visit (INDEPENDENT_AMBULATORY_CARE_PROVIDER_SITE_OTHER): Payer: BC Managed Care – PPO | Admitting: Pharmacist

## 2022-02-11 ENCOUNTER — Other Ambulatory Visit: Payer: Self-pay

## 2022-02-11 ENCOUNTER — Ambulatory Visit (INDEPENDENT_AMBULATORY_CARE_PROVIDER_SITE_OTHER): Payer: BC Managed Care – PPO

## 2022-02-11 ENCOUNTER — Other Ambulatory Visit: Payer: BC Managed Care – PPO

## 2022-02-11 ENCOUNTER — Other Ambulatory Visit (HOSPITAL_COMMUNITY)
Admission: RE | Admit: 2022-02-11 | Discharge: 2022-02-11 | Disposition: A | Discharge status: Home / Self Care | Payer: BC Managed Care – PPO | Source: Ambulatory Visit | Attending: Infectious Disease | Admitting: Infectious Disease

## 2022-02-11 DIAGNOSIS — Z23 Encounter for immunization: Secondary | ICD-10-CM | POA: Diagnosis not present

## 2022-02-11 DIAGNOSIS — Z113 Encounter for screening for infections with a predominantly sexual mode of transmission: Secondary | ICD-10-CM | POA: Diagnosis present

## 2022-02-11 DIAGNOSIS — Z79899 Other long term (current) drug therapy: Secondary | ICD-10-CM | POA: Diagnosis not present

## 2022-02-11 DIAGNOSIS — Z2981 Encounter for HIV pre-exposure prophylaxis: Secondary | ICD-10-CM

## 2022-02-11 MED ORDER — CABOTEGRAVIR ER 600 MG/3ML IM SUER
600.0000 mg | Freq: Once | INTRAMUSCULAR | Status: AC
  Administered 2022-02-11: 600 mg via INTRAMUSCULAR

## 2022-02-11 NOTE — Progress Notes (Unsigned)
HPI: Roy Wheeler is a 31 y.o. male who presents to the [facility] pharmacy clinic for Apretude administration and HIV PrEP follow up.  There are no problems to display for this patient.   Patient's Medications  New Prescriptions   No medications on file  Previous Medications   BENZONATATE (TESSALON) 100 MG CAPSULE    Take 1 capsule (100 mg total) by mouth every 8 (eight) hours.   CABOTEGRAVIR ER (APRETUDE) 600 MG/3ML INJECTION    Inject 3 mLs (600 mg total) into the muscle every 2 (two) months.   DICYCLOMINE (BENTYL) 20 MG TABLET    Take 1 tablet (20 mg total) by mouth 2 (two) times daily.   METRONIDAZOLE (FLAGYL) 500 MG TABLET    Take 1 tablet (500 mg total) by mouth 2 (two) times daily.   MULTIPLE VITAMIN (MULTIVITAMIN) CAPSULE    Take 1 capsule by mouth daily.   ONDANSETRON (ZOFRAN ODT) 4 MG DISINTEGRATING TABLET    Take 1 tablet (4 mg total) by mouth every 8 (eight) hours as needed for nausea or vomiting.   TRAMADOL (ULTRAM) 50 MG TABLET    Take 1 tablet (50 mg total) by mouth every 6 (six) hours as needed.  Modified Medications   No medications on file  Discontinued Medications   No medications on file    Allergies: No Known Allergies  Past Medical History: No past medical history on file.  Social History: Social History   Socioeconomic History   Marital status: Single    Spouse name: Not on file   Number of children: Not on file   Years of education: Not on file   Highest education level: Not on file  Occupational History   Not on file  Tobacco Use   Smoking status: Never   Smokeless tobacco: Never  Substance and Sexual Activity   Alcohol use: Yes    Comment: rarely   Drug use: No   Sexual activity: Not on file  Other Topics Concern   Not on file  Social History Narrative   Not on file   Social Determinants of Health   Financial Resource Strain: Not on file  Food Insecurity: Not on file  Transportation Needs: Not on file  Physical Activity: Not on file   Stress: Not on file  Social Connections: Not on file    Labs: Lab Results  Component Value Date   HIV1RNAQUANT Not Detected 12/10/2021   HIV1RNAQUANT Not Detected 10/17/2021   HIV1RNAQUANT Not Detected 08/20/2021    RPR and STI Lab Results  Component Value Date   LABRPR REACTIVE (A) 08/20/2021   LABRPR REACTIVE (A) 02/19/2021   LABRPR REACTIVE (A) 08/21/2020   LABRPR REACTIVE (A) 03/08/2020   LABRPR NON-REACTIVE 04/15/2019   RPRTITER 1:1 (H) 08/20/2021   RPRTITER 1:2 (H) 02/19/2021   RPRTITER 1:2 (H) 08/21/2020   RPRTITER 1:32 (H) 03/08/2020   RPRTITER 1:8 06/11/2015    STI Results GC GC CT CT  12/11/2021  9:03 AM Negative   Negative    12/10/2021  4:07 PM Negative    Negative   Negative    Negative    10/17/2021  3:34 PM Negative   Negative    09/13/2021  3:21 PM Negative   Negative    08/20/2021  4:35 PM Negative    Positive   Negative    Negative    08/20/2021  3:48 PM Negative   Negative    06/21/2021  4:05 PM Negative   Negative  02/19/2021  9:56 AM Negative   Negative    02/19/2021  9:17 AM Negative   Negative    07/16/2016 10:45 AM  NOT DETECTED   NOT DETECTED   01/28/2016  8:50 AM  NOT DETECTED   NOT DETECTED   06/11/2015 10:12 AM  NOT DETECTED   NOT DETECTED     Hepatitis B Lab Results  Component Value Date   HEPBSAB POS (A) 06/11/2015   HEPBSAG NEGATIVE 05/29/2015   HEPBCAB NON REACTIVE 06/11/2015   Hepatitis C Lab Results  Component Value Date   HEPCAB NON-REACTIVE 08/21/2020   Hepatitis A Lab Results  Component Value Date   HAV REACTIVE (A) 08/20/2021   Lipids: Lab Results  Component Value Date   CHOL 178 06/17/2017   TRIG 58 06/17/2017   HDL 46 06/17/2017   CHOLHDL 3.9 06/17/2017   VLDL 9 07/16/2016   LDLCALC 118 (H) 06/17/2017    TARGET DATE: 9th of each month   Assessment: Roy Wheeler presents today for his Apretude injection and to follow up for HIV PrEP. No issues with past injections. Screened for acute HIV symptoms such  as fatigue, muscle aches, rash, sore throat, lymphadenopathy, headache, night sweats, nausea/vomiting/diarrhea, and fever. Denies any symptoms. No known exposures to any STIs and no signs or symptoms of any STIs today. He does report a recurrent ingrown hair, but reports no redness or induration surrounding the site to indicate infection.   Per [employer] guidelines, a rapid HIV test should be drawn prior to Apretude administration. Due to state shortage of rapid HIV tests, this is temporarily unable to be done. Per decision from [facility] physicians, we will proceed with Apretude administration at this time without a negative rapid HIV test beforehand. HIV RNA was collected today and is in process.  Administered cabotegravir 600mg /26mL in right upper outer quadrant of the gluteal muscle. Injection was tolerated well without issue. Will see him back in 2 months for injection, labs, and HIV PrEP follow up.  Roy Wheeler was also eligible for a number of vaccines. After discussion of the risks and benefits, Roy Wheeler opted to take the following actions on the vaccines he was eligible for:  - COVID Booster administered in clinic  - Jynneos #1/2 administered in clinic  - HPV deferred   Plan: - Apretude injection administered - HIV RNA today - F/U urine, rectal, and oral cytologies, RPR  - Administer COVID booster IM x1, Jynneos #1/2 administered SQ in clinic  - Next injection, labs, and PrEP follow up appointment scheduled for 04/17/22 with 06/16/22  - Call with any issues or questions  Marchelle Folks, PharmD PGY-2 Infectious Diseases Resident  02/11/2022 11:52 AM

## 2022-02-13 LAB — CYTOLOGY, (ORAL, ANAL, URETHRAL) ANCILLARY ONLY
Chlamydia: NEGATIVE
Chlamydia: NEGATIVE
Comment: NEGATIVE
Comment: NEGATIVE
Comment: NORMAL
Comment: NORMAL
Neisseria Gonorrhea: NEGATIVE
Neisseria Gonorrhea: NEGATIVE

## 2022-02-13 LAB — URINE CYTOLOGY ANCILLARY ONLY
Chlamydia: NEGATIVE
Comment: NEGATIVE
Comment: NORMAL
Neisseria Gonorrhea: NEGATIVE

## 2022-02-15 LAB — HIV-1 RNA QUANT-NO REFLEX-BLD
HIV 1 RNA Quant: NOT DETECTED Copies/mL
HIV-1 RNA Quant, Log: NOT DETECTED Log cps/mL

## 2022-02-15 LAB — RPR TITER: RPR Titer: 1:1 {titer} — ABNORMAL HIGH

## 2022-02-15 LAB — RPR: RPR Ser Ql: REACTIVE — AB

## 2022-02-15 LAB — FLUORESCENT TREPONEMAL AB(FTA)-IGG-BLD: Fluorescent Treponemal ABS: REACTIVE — AB

## 2022-04-17 ENCOUNTER — Other Ambulatory Visit: Payer: Self-pay

## 2022-04-17 ENCOUNTER — Other Ambulatory Visit (HOSPITAL_COMMUNITY)
Admission: RE | Admit: 2022-04-17 | Discharge: 2022-04-17 | Disposition: A | Discharge status: Home / Self Care | Payer: BC Managed Care – PPO | Source: Ambulatory Visit | Attending: Infectious Disease | Admitting: Infectious Disease

## 2022-04-17 ENCOUNTER — Ambulatory Visit: Payer: BC Managed Care – PPO | Admitting: Pharmacist

## 2022-04-17 ENCOUNTER — Ambulatory Visit (INDEPENDENT_AMBULATORY_CARE_PROVIDER_SITE_OTHER): Payer: BC Managed Care – PPO | Admitting: Pharmacist

## 2022-04-17 DIAGNOSIS — Z113 Encounter for screening for infections with a predominantly sexual mode of transmission: Secondary | ICD-10-CM | POA: Diagnosis present

## 2022-04-17 DIAGNOSIS — Z23 Encounter for immunization: Secondary | ICD-10-CM | POA: Diagnosis not present

## 2022-04-17 DIAGNOSIS — Z79899 Other long term (current) drug therapy: Secondary | ICD-10-CM

## 2022-04-17 DIAGNOSIS — Z2981 Encounter for HIV pre-exposure prophylaxis: Secondary | ICD-10-CM | POA: Diagnosis not present

## 2022-04-17 MED ORDER — CABOTEGRAVIR ER 600 MG/3ML IM SUER
600.0000 mg | Freq: Once | INTRAMUSCULAR | Status: AC
  Administered 2022-04-17: 600 mg via INTRAMUSCULAR

## 2022-04-17 NOTE — Progress Notes (Signed)
HPI: Roy Wheeler is a 32 y.o. male who presents to the [facility] for Apretude administration and HIV PrEP follow up.  There are no problems to display for this patient.   Patient's Medications  New Prescriptions   No medications on file  Previous Medications   BENZONATATE (TESSALON) 100 MG CAPSULE    Take 1 capsule (100 mg total) by mouth every 8 (eight) hours.   CABOTEGRAVIR ER (APRETUDE) 600 MG/3ML INJECTION    Inject 3 mLs (600 mg total) into the muscle every 2 (two) months.   DICYCLOMINE (BENTYL) 20 MG TABLET    Take 1 tablet (20 mg total) by mouth 2 (two) times daily.   METRONIDAZOLE (FLAGYL) 500 MG TABLET    Take 1 tablet (500 mg total) by mouth 2 (two) times daily.   MULTIPLE VITAMIN (MULTIVITAMIN) CAPSULE    Take 1 capsule by mouth daily.   ONDANSETRON (ZOFRAN ODT) 4 MG DISINTEGRATING TABLET    Take 1 tablet (4 mg total) by mouth every 8 (eight) hours as needed for nausea or vomiting.   TRAMADOL (ULTRAM) 50 MG TABLET    Take 1 tablet (50 mg total) by mouth every 6 (six) hours as needed.  Modified Medications   No medications on file  Discontinued Medications   No medications on file    Allergies: No Known Allergies  Past Medical History: No past medical history on file.  Social History: Social History   Socioeconomic History   Marital status: Single    Spouse name: Not on file   Number of children: Not on file   Years of education: Not on file   Highest education level: Not on file  Occupational History   Not on file  Tobacco Use   Smoking status: Never   Smokeless tobacco: Never  Substance and Sexual Activity   Alcohol use: Yes    Comment: rarely   Drug use: No   Sexual activity: Not on file  Other Topics Concern   Not on file  Social History Narrative   Not on file   Social Determinants of Health   Financial Resource Strain: Not on file  Food Insecurity: Not on file  Transportation Needs: Not on file  Physical Activity: Not on file   Stress: Not on file  Social Connections: Not on file    Labs: Lab Results  Component Value Date   HIV1RNAQUANT Not Detected 02/11/2022   HIV1RNAQUANT Not Detected 12/10/2021   HIV1RNAQUANT Not Detected 10/17/2021    RPR and STI Lab Results  Component Value Date   LABRPR REACTIVE (A) 02/11/2022   LABRPR REACTIVE (A) 08/20/2021   LABRPR REACTIVE (A) 02/19/2021   LABRPR REACTIVE (A) 08/21/2020   LABRPR REACTIVE (A) 03/08/2020   RPRTITER 1:1 (H) 02/11/2022   RPRTITER 1:1 (H) 08/20/2021   RPRTITER 1:2 (H) 02/19/2021   RPRTITER 1:2 (H) 08/21/2020   RPRTITER 1:32 (H) 03/08/2020    STI Results GC GC CT CT  02/11/2022  4:27 PM Negative    Negative    Negative   Negative    Negative    Negative    12/11/2021  9:03 AM Negative   Negative    12/10/2021  4:07 PM Negative    Negative   Negative    Negative    10/17/2021  3:34 PM Negative   Negative    09/13/2021  3:21 PM Negative   Negative    08/20/2021  4:35 PM Negative    Positive  Negative    Negative    08/20/2021  3:48 PM Negative   Negative    06/21/2021  4:05 PM Negative   Negative    02/19/2021  9:56 AM Negative   Negative    02/19/2021  9:17 AM Negative   Negative    07/16/2016 10:45 AM  NOT DETECTED   NOT DETECTED   01/28/2016  8:50 AM  NOT DETECTED   NOT DETECTED   06/11/2015 10:12 AM  NOT DETECTED   NOT DETECTED     Hepatitis B Lab Results  Component Value Date   HEPBSAB POS (A) 06/11/2015   HEPBSAG NEGATIVE 05/29/2015   HEPBCAB NON REACTIVE 06/11/2015   Hepatitis C Lab Results  Component Value Date   HEPCAB NON-REACTIVE 08/21/2020   Hepatitis A Lab Results  Component Value Date   HAV REACTIVE (A) 08/20/2021   Lipids: Lab Results  Component Value Date   CHOL 178 06/17/2017   TRIG 58 06/17/2017   HDL 46 06/17/2017   CHOLHDL 3.9 06/17/2017   VLDL 9 07/16/2016   LDLCALC 118 (H) 06/17/2017    TARGET DATE: 9th  Assessment: [name] presents today for Apretude injection and to follow up  for HIV PrEP. No issues with past injections. Screened for acute HIV symptoms such as fatigue, muscle aches, rash, sore throat, lymphadenopathy, headache, night sweats, nausea/vomiting/diarrhea, and fever. Denies any symptoms. No known exposures to any STIs since last visit. Agrees to full STI testing today with RPR and oral/urine/rectal cytologies.   Per [employer] guidelines, a rapid HIV test should be drawn prior to Apretude administration. Due to state shortage of rapid HIV tests, this is temporarily unable to be done. Per decision from [place], we will proceed with Apretude administration at this time without a negative rapid HIV test beforehand. HIV RNA was collected today and is in process.  Administered cabotegravir 600mg /34mL in right upper outer quadrant of the gluteal muscle. Received first dose of monkeypox on 12/5, today he accepted offer for Tdap, HPV and second monkeypox vaccine. Will see back in 2 months for injection, labs, and HIV PrEP follow up.  Plan: - Apretude injection administered - HIV RNA today - 2nd monkeypox administered in R arm,  - 1st HPV and Tdap administered in R deltoid - Next injection, labs, and PrEP follow up appointment scheduled for 06/11/22 with Estill Bamberg - Call with any issues or questions  Titus Dubin, PharmD PGY1 Pharmacy Resident 04/17/2022 9:40 AM

## 2022-04-18 LAB — URINE CYTOLOGY ANCILLARY ONLY
Chlamydia: NEGATIVE
Comment: NEGATIVE
Comment: NORMAL
Neisseria Gonorrhea: NEGATIVE

## 2022-04-18 LAB — CYTOLOGY, (ORAL, ANAL, URETHRAL) ANCILLARY ONLY
Chlamydia: NEGATIVE
Chlamydia: NEGATIVE
Comment: NEGATIVE
Comment: NEGATIVE
Comment: NORMAL
Comment: NORMAL
Neisseria Gonorrhea: NEGATIVE
Neisseria Gonorrhea: NEGATIVE

## 2022-04-20 LAB — HIV-1 RNA QUANT-NO REFLEX-BLD
HIV 1 RNA Quant: NOT DETECTED Copies/mL
HIV-1 RNA Quant, Log: NOT DETECTED Log cps/mL

## 2022-04-20 LAB — RPR: RPR Ser Ql: NONREACTIVE

## 2022-06-11 ENCOUNTER — Other Ambulatory Visit (HOSPITAL_COMMUNITY)
Admission: RE | Admit: 2022-06-11 | Discharge: 2022-06-11 | Disposition: A | Discharge status: Home / Self Care | Payer: BC Managed Care – PPO | Source: Ambulatory Visit | Attending: Infectious Disease | Admitting: Infectious Disease

## 2022-06-11 ENCOUNTER — Ambulatory Visit (INDEPENDENT_AMBULATORY_CARE_PROVIDER_SITE_OTHER): Payer: BC Managed Care – PPO | Admitting: Pharmacist

## 2022-06-11 ENCOUNTER — Other Ambulatory Visit: Payer: Self-pay

## 2022-06-11 DIAGNOSIS — Z113 Encounter for screening for infections with a predominantly sexual mode of transmission: Secondary | ICD-10-CM | POA: Diagnosis present

## 2022-06-11 DIAGNOSIS — Z23 Encounter for immunization: Secondary | ICD-10-CM

## 2022-06-11 DIAGNOSIS — Z79899 Other long term (current) drug therapy: Secondary | ICD-10-CM

## 2022-06-11 DIAGNOSIS — Z2981 Encounter for HIV pre-exposure prophylaxis: Secondary | ICD-10-CM | POA: Diagnosis not present

## 2022-06-11 MED ORDER — CABOTEGRAVIR ER 600 MG/3ML IM SUER
600.0000 mg | Freq: Once | INTRAMUSCULAR | Status: AC
  Administered 2022-06-11: 600 mg via INTRAMUSCULAR

## 2022-06-11 NOTE — Progress Notes (Signed)
HPI: Roy Wheeler is a 32 y.o. male who presents to the [facility] for Apretude administration and HIV PrEP follow up.  There are no problems to display for this patient.   Patient's Medications  New Prescriptions   No medications on file  Previous Medications   BENZONATATE (TESSALON) 100 MG CAPSULE    Take 1 capsule (100 mg total) by mouth every 8 (eight) hours.   CABOTEGRAVIR ER (APRETUDE) 600 MG/3ML INJECTION    Inject 3 mLs (600 mg total) into the muscle every 2 (two) months.   DICYCLOMINE (BENTYL) 20 MG TABLET    Take 1 tablet (20 mg total) by mouth 2 (two) times daily.   METRONIDAZOLE (FLAGYL) 500 MG TABLET    Take 1 tablet (500 mg total) by mouth 2 (two) times daily.   MULTIPLE VITAMIN (MULTIVITAMIN) CAPSULE    Take 1 capsule by mouth daily.   ONDANSETRON (ZOFRAN ODT) 4 MG DISINTEGRATING TABLET    Take 1 tablet (4 mg total) by mouth every 8 (eight) hours as needed for nausea or vomiting.   TRAMADOL (ULTRAM) 50 MG TABLET    Take 1 tablet (50 mg total) by mouth every 6 (six) hours as needed.  Modified Medications   No medications on file  Discontinued Medications   No medications on file    Allergies: No Known Allergies  Past Medical History: No past medical history on file.  Social History: Social History   Socioeconomic History   Marital status: Single    Spouse name: Not on file   Number of children: Not on file   Years of education: Not on file   Highest education level: Not on file  Occupational History   Not on file  Tobacco Use   Smoking status: Never   Smokeless tobacco: Never  Substance and Sexual Activity   Alcohol use: Yes    Comment: rarely   Drug use: No   Sexual activity: Not on file  Other Topics Concern   Not on file  Social History Narrative   Not on file   Social Determinants of Health   Financial Resource Strain: Not on file  Food Insecurity: Not on file  Transportation Needs: Not on file  Physical Activity: Not on file   Stress: Not on file  Social Connections: Not on file    Labs: Lab Results  Component Value Date   HIV1RNAQUANT Not Detected 04/17/2022   HIV1RNAQUANT Not Detected 02/11/2022   HIV1RNAQUANT Not Detected 12/10/2021    RPR and STI Lab Results  Component Value Date   LABRPR NON-REACTIVE 04/17/2022   LABRPR REACTIVE (A) 02/11/2022   LABRPR REACTIVE (A) 08/20/2021   LABRPR REACTIVE (A) 02/19/2021   LABRPR REACTIVE (A) 08/21/2020   RPRTITER 1:1 (H) 02/11/2022   RPRTITER 1:1 (H) 08/20/2021   RPRTITER 1:2 (H) 02/19/2021   RPRTITER 1:2 (H) 08/21/2020   RPRTITER 1:32 (H) 03/08/2020    STI Results GC GC CT CT  04/17/2022  9:34 AM Negative    Negative    Negative   Negative    Negative    Negative    02/11/2022  4:27 PM Negative    Negative    Negative   Negative    Negative    Negative    12/11/2021  9:03 AM Negative   Negative    12/10/2021  4:07 PM Negative    Negative   Negative    Negative    10/17/2021  3:34 PM Negative  Negative    09/13/2021  3:21 PM Negative   Negative    08/20/2021  4:35 PM Negative    Positive   Negative    Negative    08/20/2021  3:48 PM Negative   Negative    06/21/2021  4:05 PM Negative   Negative    02/19/2021  9:56 AM Negative   Negative    02/19/2021  9:17 AM Negative   Negative    07/16/2016 10:45 AM  NOT DETECTED   NOT DETECTED   01/28/2016  8:50 AM  NOT DETECTED   NOT DETECTED   06/11/2015 10:12 AM  NOT DETECTED   NOT DETECTED     Hepatitis B Lab Results  Component Value Date   HEPBSAB POS (A) 06/11/2015   HEPBSAG NEGATIVE 05/29/2015   HEPBCAB NON REACTIVE 06/11/2015   Hepatitis C Lab Results  Component Value Date   HEPCAB NON-REACTIVE 08/21/2020   Hepatitis A Lab Results  Component Value Date   HAV REACTIVE (A) 08/20/2021   Lipids: Lab Results  Component Value Date   CHOL 178 06/17/2017   TRIG 58 06/17/2017   HDL 46 06/17/2017   CHOLHDL 3.9 06/17/2017   VLDL 9 07/16/2016   LDLCALC 118 (H) 06/17/2017     TARGET DATE: The 9th of the month  Current PrEP Regimen: Apretude  Assessment: Roy Wheeler presents today for their Apretude injection and to follow up for HIV PrEP. No issues with past injections. No known exposures to any STIs and no signs or symptoms of any STIs today. Screened patient for acute HIV symptoms such as fatigue, muscle aches, rash, sore throat, lymphadenopathy, headache, night sweats, nausea/vomiting/diarrhea, and fever. Patient denies any symptoms. No new partners since last injection. Patient had HIV RNA drawn today along with RPR and urine cytologies. Also administered patient's second HPV vaccine today; third dose due in 3-4 months.   Per [employer] guidelines, a rapid HIV test should be drawn prior to Apretude administration. Due to state shortage of rapid HIV tests, this is temporarily unable to be done. Per decision from [place], we will proceed with Apretude administration at this time without a negative rapid HIV test beforehand. HIV RNA was collected today and is in process.  Administered cabotegravir 600mg /86mL in right upper outer quadrant of the gluteal muscle. Will make follow up appointments for maintenance injections every 2 months.   Plan: - Maintenance injections scheduled for 6/5 with me  - Check HIV RNA, RPR, and urine cytologies - Administered 2nd HPV vaccine (3rd due in August)  - Call with any issues or questions  Alfonse Spruce, PharmD, CPP, BCIDP, [place] Clinical Pharmacist Practitioner Infectious Diseases [facility]

## 2022-06-13 LAB — RPR TITER: RPR Titer: 1:1 {titer} — ABNORMAL HIGH

## 2022-06-13 LAB — URINE CYTOLOGY ANCILLARY ONLY
Chlamydia: NEGATIVE
Comment: NEGATIVE
Comment: NORMAL
Neisseria Gonorrhea: NEGATIVE

## 2022-06-13 LAB — HIV-1 RNA QUANT-NO REFLEX-BLD
HIV 1 RNA Quant: NOT DETECTED Copies/mL
HIV-1 RNA Quant, Log: NOT DETECTED Log cps/mL

## 2022-06-13 LAB — RPR: RPR Ser Ql: REACTIVE — AB

## 2022-06-13 LAB — T PALLIDUM AB: T Pallidum Abs: POSITIVE — AB

## 2022-06-24 ENCOUNTER — Telehealth: Payer: Self-pay

## 2022-06-24 NOTE — Telephone Encounter (Signed)
[facility] Patient Advocate Encounter  I went on [employer] portal for Apretude ([id number]), NO PA required.   Notification will be in media  Clearance Coots, CPhT Specialty Pharmacy Patient [facility] Phone: [phone] Fax:  [phone]

## 2022-08-13 ENCOUNTER — Other Ambulatory Visit (HOSPITAL_COMMUNITY)
Admission: RE | Admit: 2022-08-13 | Discharge: 2022-08-13 | Disposition: A | Discharge status: Home / Self Care | Payer: BC Managed Care – PPO | Source: Ambulatory Visit | Attending: Infectious Disease | Admitting: Infectious Disease

## 2022-08-13 ENCOUNTER — Ambulatory Visit (INDEPENDENT_AMBULATORY_CARE_PROVIDER_SITE_OTHER): Payer: BC Managed Care – PPO | Admitting: Pharmacist

## 2022-08-13 ENCOUNTER — Other Ambulatory Visit: Payer: Self-pay

## 2022-08-13 DIAGNOSIS — Z113 Encounter for screening for infections with a predominantly sexual mode of transmission: Secondary | ICD-10-CM | POA: Diagnosis present

## 2022-08-13 DIAGNOSIS — Z2981 Encounter for HIV pre-exposure prophylaxis: Secondary | ICD-10-CM | POA: Diagnosis not present

## 2022-08-13 DIAGNOSIS — Z79899 Other long term (current) drug therapy: Secondary | ICD-10-CM

## 2022-08-13 MED ORDER — CABOTEGRAVIR ER 600 MG/3ML IM SUER
600.0000 mg | Freq: Once | INTRAMUSCULAR | Status: AC
Start: 2022-08-13 — End: 2022-08-13
  Administered 2022-08-13: 600 mg via INTRAMUSCULAR

## 2022-08-13 NOTE — Progress Notes (Signed)
HPI: Roy Wheeler is a 32 y.o. male who presents to the [facility] pharmacy clinic for Apretude administration and HIV PrEP follow up.  There are no problems to display for this patient.   Patient's Medications  New Prescriptions   No medications on file  Previous Medications   BENZONATATE (TESSALON) 100 MG CAPSULE    Take 1 capsule (100 mg total) by mouth every 8 (eight) hours.   CABOTEGRAVIR ER (APRETUDE) 600 MG/3ML INJECTION    Inject 3 mLs (600 mg total) into the muscle every 2 (two) months.   DICYCLOMINE (BENTYL) 20 MG TABLET    Take 1 tablet (20 mg total) by mouth 2 (two) times daily.   METRONIDAZOLE (FLAGYL) 500 MG TABLET    Take 1 tablet (500 mg total) by mouth 2 (two) times daily.   MULTIPLE VITAMIN (MULTIVITAMIN) CAPSULE    Take 1 capsule by mouth daily.   ONDANSETRON (ZOFRAN ODT) 4 MG DISINTEGRATING TABLET    Take 1 tablet (4 mg total) by mouth every 8 (eight) hours as needed for nausea or vomiting.   TRAMADOL (ULTRAM) 50 MG TABLET    Take 1 tablet (50 mg total) by mouth every 6 (six) hours as needed.  Modified Medications   No medications on file  Discontinued Medications   No medications on file    Allergies: No Known Allergies  Past Medical History: No past medical history on file.  Social History: Social History   Socioeconomic History   Marital status: Single    Spouse name: Not on file   Number of children: Not on file   Years of education: Not on file   Highest education level: Not on file  Occupational History   Not on file  Tobacco Use   Smoking status: Never   Smokeless tobacco: Never  Substance and Sexual Activity   Alcohol use: Yes    Comment: rarely   Drug use: No   Sexual activity: Not on file  Other Topics Concern   Not on file  Social History Narrative   Not on file   Social Determinants of Health   Financial Resource Strain: Not on file  Food Insecurity: Not on file  Transportation Needs: Not on file  Physical Activity: Not on file   Stress: Not on file  Social Connections: Not on file    Labs: Lab Results  Component Value Date   HIV1RNAQUANT Not Detected 06/11/2022   HIV1RNAQUANT Not Detected 04/17/2022   HIV1RNAQUANT Not Detected 02/11/2022    RPR and STI Lab Results  Component Value Date   LABRPR REACTIVE (A) 06/11/2022   LABRPR NON-REACTIVE 04/17/2022   LABRPR REACTIVE (A) 02/11/2022   LABRPR REACTIVE (A) 08/20/2021   LABRPR REACTIVE (A) 02/19/2021   RPRTITER 1:1 (H) 06/11/2022   RPRTITER 1:1 (H) 02/11/2022   RPRTITER 1:1 (H) 08/20/2021   RPRTITER 1:2 (H) 02/19/2021   RPRTITER 1:2 (H) 08/21/2020    STI Results GC GC CT CT  06/11/2022  4:25 PM Negative   Negative    04/17/2022  9:34 AM Negative    Negative    Negative   Negative    Negative    Negative    02/11/2022  4:27 PM Negative    Negative    Negative   Negative    Negative    Negative    12/11/2021  9:03 AM Negative   Negative    12/10/2021  4:07 PM Negative    Negative   Negative  Negative    10/17/2021  3:34 PM Negative   Negative    09/13/2021  3:21 PM Negative   Negative    08/20/2021  4:35 PM Negative    Positive   Negative    Negative    08/20/2021  3:48 PM Negative   Negative    06/21/2021  4:05 PM Negative   Negative    02/19/2021  9:56 AM Negative   Negative    02/19/2021  9:17 AM Negative   Negative    07/16/2016 10:45 AM  NOT DETECTED   NOT DETECTED   01/28/2016  8:50 AM  NOT DETECTED   NOT DETECTED   06/11/2015 10:12 AM  NOT DETECTED   NOT DETECTED     Hepatitis B Lab Results  Component Value Date   HEPBSAB POS (A) 06/11/2015   HEPBSAG NEGATIVE 05/29/2015   HEPBCAB NON REACTIVE 06/11/2015   Hepatitis C Lab Results  Component Value Date   HEPCAB NON-REACTIVE 08/21/2020   Hepatitis A Lab Results  Component Value Date   HAV REACTIVE (A) 08/20/2021   Lipids: Lab Results  Component Value Date   CHOL 178 06/17/2017   TRIG 58 06/17/2017   HDL 46 06/17/2017   CHOLHDL 3.9 06/17/2017   VLDL 9  07/16/2016   LDLCALC 118 (H) 06/17/2017    TARGET DATE: The 9th of the month  Current PrEP Regimen: Apretude  Assessment: Roy Wheeler presents today for their Apretude injection and to follow up for HIV PrEP. No issues with past injections. No known exposures to any STIs and no signs or symptoms of any STIs today. Last STI screening was in April and was negative. Screened patient for acute HIV symptoms such as fatigue, muscle aches, rash, sore throat, lymphadenopathy, headache, night sweats, nausea/vomiting/diarrhea, and fever. Patient denies any symptoms. One new partner since last injection; will check urine/oral/rectal cytologies.   Per [employer] guidelines, a rapid HIV test should be drawn prior to Apretude administration. Due to state shortage of rapid HIV tests, this is temporarily unable to be done. Per decision from [facility] physicians, we will proceed with Apretude administration at this time without a negative rapid HIV test beforehand. HIV RNA was collected today and is in process.  Administered cabotegravir 600mg /18mL in right upper outer quadrant of the gluteal muscle. Will make follow up appointments for maintenance injections every 2 months.   Plan: - Maintenance injections scheduled for 8/1 with me  - Check HIV RNA, RPR, urine/oral/rectal cytologies  - Call with any issues or questions  Margarite Gouge, PharmD, CPP, BCIDP, AAHIVP Clinical Pharmacist Practitioner Infectious Diseases Clinical Pharmacist [facility]

## 2022-08-15 LAB — URINE CYTOLOGY ANCILLARY ONLY
Chlamydia: NEGATIVE
Comment: NEGATIVE
Comment: NORMAL
Neisseria Gonorrhea: NEGATIVE

## 2022-08-15 LAB — CYTOLOGY, (ORAL, ANAL, URETHRAL) ANCILLARY ONLY
Chlamydia: NEGATIVE
Chlamydia: NEGATIVE
Comment: NEGATIVE
Comment: NEGATIVE
Comment: NORMAL
Comment: NORMAL
Neisseria Gonorrhea: NEGATIVE
Neisseria Gonorrhea: NEGATIVE

## 2022-08-16 LAB — HIV-1 RNA QUANT-NO REFLEX-BLD
HIV 1 RNA Quant: NOT DETECTED Copies/mL
HIV-1 RNA Quant, Log: NOT DETECTED Log cps/mL

## 2022-10-06 ENCOUNTER — Encounter: Payer: Self-pay | Admitting: Pharmacist

## 2022-10-09 ENCOUNTER — Other Ambulatory Visit: Payer: Self-pay

## 2022-10-09 ENCOUNTER — Other Ambulatory Visit (HOSPITAL_COMMUNITY)
Admission: RE | Admit: 2022-10-09 | Discharge: 2022-10-09 | Disposition: A | Discharge status: Home / Self Care | Payer: BC Managed Care – PPO | Source: Ambulatory Visit | Attending: Infectious Disease | Admitting: Infectious Disease

## 2022-10-09 ENCOUNTER — Ambulatory Visit (INDEPENDENT_AMBULATORY_CARE_PROVIDER_SITE_OTHER): Payer: BC Managed Care – PPO | Admitting: Pharmacist

## 2022-10-09 DIAGNOSIS — Z79899 Other long term (current) drug therapy: Secondary | ICD-10-CM

## 2022-10-09 DIAGNOSIS — Z23 Encounter for immunization: Secondary | ICD-10-CM

## 2022-10-09 DIAGNOSIS — Z113 Encounter for screening for infections with a predominantly sexual mode of transmission: Secondary | ICD-10-CM

## 2022-10-09 DIAGNOSIS — Z2981 Encounter for HIV pre-exposure prophylaxis: Secondary | ICD-10-CM | POA: Diagnosis not present

## 2022-10-09 MED ORDER — CABOTEGRAVIR ER 600 MG/3ML IM SUER
600.0000 mg | Freq: Once | INTRAMUSCULAR | Status: AC
Start: 2022-10-09 — End: 2022-10-09
  Administered 2022-10-09: 600 mg via INTRAMUSCULAR

## 2022-10-09 NOTE — Progress Notes (Signed)
HPI: Roy Wheeler is a 32 y.o. male who presents to the [facility] pharmacy clinic for Apretude administration and HIV PrEP follow up.  There are no problems to display for this patient.   Patient's Medications  New Prescriptions   No medications on file  Previous Medications   BENZONATATE (TESSALON) 100 MG CAPSULE    Take 1 capsule (100 mg total) by mouth every 8 (eight) hours.   CABOTEGRAVIR ER (APRETUDE) 600 MG/3ML INJECTION    Inject 3 mLs (600 mg total) into the muscle every 2 (two) months.   DICYCLOMINE (BENTYL) 20 MG TABLET    Take 1 tablet (20 mg total) by mouth 2 (two) times daily.   METRONIDAZOLE (FLAGYL) 500 MG TABLET    Take 1 tablet (500 mg total) by mouth 2 (two) times daily.   MULTIPLE VITAMIN (MULTIVITAMIN) CAPSULE    Take 1 capsule by mouth daily.   ONDANSETRON (ZOFRAN ODT) 4 MG DISINTEGRATING TABLET    Take 1 tablet (4 mg total) by mouth every 8 (eight) hours as needed for nausea or vomiting.   TRAMADOL (ULTRAM) 50 MG TABLET    Take 1 tablet (50 mg total) by mouth every 6 (six) hours as needed.  Modified Medications   No medications on file  Discontinued Medications   No medications on file    Allergies: No Known Allergies  Past Medical History: No past medical history on file.  Social History: Social History   Socioeconomic History   Marital status: Single    Spouse name: Not on file   Number of children: Not on file   Years of education: Not on file   Highest education level: Not on file  Occupational History   Not on file  Tobacco Use   Smoking status: Never   Smokeless tobacco: Never  Substance and Sexual Activity   Alcohol use: Yes    Comment: rarely   Drug use: No   Sexual activity: Not on file  Other Topics Concern   Not on file  Social History Narrative   Not on file   Social Determinants of Health   Financial Resource Strain: Not on file  Food Insecurity: Not on file  Transportation Needs: Not on file  Physical Activity: Not on file   Stress: Not on file  Social Connections: Not on file    Labs: Lab Results  Component Value Date   HIV1RNAQUANT Not Detected 08/13/2022   HIV1RNAQUANT Not Detected 06/11/2022   HIV1RNAQUANT Not Detected 04/17/2022    RPR and STI Lab Results  Component Value Date   LABRPR REACTIVE (A) 06/11/2022   LABRPR NON-REACTIVE 04/17/2022   LABRPR REACTIVE (A) 02/11/2022   LABRPR REACTIVE (A) 08/20/2021   LABRPR REACTIVE (A) 02/19/2021   RPRTITER 1:1 (H) 06/11/2022   RPRTITER 1:1 (H) 02/11/2022   RPRTITER 1:1 (H) 08/20/2021   RPRTITER 1:2 (H) 02/19/2021   RPRTITER 1:2 (H) 08/21/2020    STI Results GC GC CT CT  08/13/2022  4:05 PM Negative    Negative    Negative   Negative    Negative    Negative    06/11/2022  4:25 PM Negative   Negative    04/17/2022  9:34 AM Negative    Negative    Negative   Negative    Negative    Negative    02/11/2022  4:27 PM Negative    Negative    Negative   Negative    Negative    Negative  12/11/2021  9:03 AM Negative   Negative    12/10/2021  4:07 PM Negative    Negative   Negative    Negative    10/17/2021  3:34 PM Negative   Negative    09/13/2021  3:21 PM Negative   Negative    08/20/2021  4:35 PM Negative    Positive   Negative    Negative    08/20/2021  3:48 PM Negative   Negative    06/21/2021  4:05 PM Negative   Negative    02/19/2021  9:56 AM Negative   Negative    02/19/2021  9:17 AM Negative   Negative    07/16/2016 10:45 AM  NOT DETECTED   NOT DETECTED   01/28/2016  8:50 AM  NOT DETECTED   NOT DETECTED   06/11/2015 10:12 AM  NOT DETECTED   NOT DETECTED     Hepatitis B Lab Results  Component Value Date   HEPBSAB POS (A) 06/11/2015   HEPBSAG NEGATIVE 05/29/2015   HEPBCAB NON REACTIVE 06/11/2015   Hepatitis C Lab Results  Component Value Date   HEPCAB NON-REACTIVE 08/21/2020   Hepatitis A Lab Results  Component Value Date   HAV REACTIVE (A) 08/20/2021   Lipids: Lab Results  Component Value Date   CHOL 178  06/17/2017   TRIG 58 06/17/2017   HDL 46 06/17/2017   CHOLHDL 3.9 06/17/2017   VLDL 9 07/16/2016   LDLCALC 118 (H) 06/17/2017    TARGET DATE: The 9th of the month  Current PrEP Regimen: Apretude  Assessment: Roy Wheeler presents today for their Apretude injection and to follow up for HIV PrEP. No issues with past injections. No known exposures to any STIs and no signs or symptoms of any STIs today. Last STI screening was in June and was negative. Screened patient for acute HIV symptoms such as fatigue, muscle aches, rash, sore throat, lymphadenopathy, headache, night sweats, nausea/vomiting/diarrhea, and fever. Patient denies any symptoms. No new partners since last injection; still requests urine cytologies and RPR today.   Per [employer] guidelines, a rapid HIV test should be drawn prior to Apretude administration. Due to state shortage of rapid HIV tests, this is temporarily unable to be done. Per decision from [facility] physicians, we will proceed with Apretude administration at this time without a negative rapid HIV test beforehand. HIV RNA was collected today and is in process.  Today is technically one day too soon before his window to receive injections; he is unable to reschedule within his window. Will administer today and ensure future appointments are within his window. Administered cabotegravir 600mg /8mL in right upper outer quadrant of the gluteal muscle. Will make follow up appointments for maintenance injections every 2 months.   Due for third and final HPV vaccine which was administered today.   Plan: - Maintenance injections scheduled for 10/2 and 12/4 with me  - Check HIV RNA, RPR, and urine cytologies  - Administer 3/3 HPV vaccine  - Call with any issues or questions  Margarite Gouge, PharmD, CPP, BCIDP, AAHIVP Clinical Pharmacist Practitioner Infectious Diseases Clinical Pharmacist [facility]

## 2022-12-08 NOTE — Progress Notes (Unsigned)
HPI: Roy Wheeler is a 32 y.o. male who presents to the [facility] pharmacy clinic for Apretude administration and HIV PrEP follow up.  There are no problems to display for this patient.   Patient's Medications  New Prescriptions   No medications on file  Previous Medications   BENZONATATE (TESSALON) 100 MG CAPSULE    Take 1 capsule (100 mg total) by mouth every 8 (eight) hours.   CABOTEGRAVIR ER (APRETUDE) 600 MG/3ML INJECTION    Inject 3 mLs (600 mg total) into the muscle every 2 (two) months.   DICYCLOMINE (BENTYL) 20 MG TABLET    Take 1 tablet (20 mg total) by mouth 2 (two) times daily.   METRONIDAZOLE (FLAGYL) 500 MG TABLET    Take 1 tablet (500 mg total) by mouth 2 (two) times daily.   MULTIPLE VITAMIN (MULTIVITAMIN) CAPSULE    Take 1 capsule by mouth daily.   ONDANSETRON (ZOFRAN ODT) 4 MG DISINTEGRATING TABLET    Take 1 tablet (4 mg total) by mouth every 8 (eight) hours as needed for nausea or vomiting.   TRAMADOL (ULTRAM) 50 MG TABLET    Take 1 tablet (50 mg total) by mouth every 6 (six) hours as needed.  Modified Medications   No medications on file  Discontinued Medications   No medications on file    Allergies: No Known Allergies  Past Medical History: No past medical history on file.  Social History: Social History   Socioeconomic History   Marital status: Single    Spouse name: Not on file   Number of children: Not on file   Years of education: Not on file   Highest education level: Not on file  Occupational History   Not on file  Tobacco Use   Smoking status: Never   Smokeless tobacco: Never  Substance and Sexual Activity   Alcohol use: Yes    Comment: rarely   Drug use: No   Sexual activity: Not on file  Other Topics Concern   Not on file  Social History Narrative   Not on file   Social Determinants of Health   Financial Resource Strain: Not on file  Food Insecurity: Not on file  Transportation Needs: Not on file  Physical Activity: Not on file   Stress: Not on file  Social Connections: Not on file    Labs: Lab Results  Component Value Date   HIV1RNAQUANT Not Detected 10/09/2022   HIV1RNAQUANT Not Detected 08/13/2022   HIV1RNAQUANT Not Detected 06/11/2022    RPR and STI Lab Results  Component Value Date   LABRPR NON-REACTIVE 10/09/2022   LABRPR REACTIVE (A) 06/11/2022   LABRPR NON-REACTIVE 04/17/2022   LABRPR REACTIVE (A) 02/11/2022   LABRPR REACTIVE (A) 08/20/2021   RPRTITER 1:1 (H) 06/11/2022   RPRTITER 1:1 (H) 02/11/2022   RPRTITER 1:1 (H) 08/20/2021   RPRTITER 1:2 (H) 02/19/2021   RPRTITER 1:2 (H) 08/21/2020    STI Results GC GC CT CT  10/09/2022  3:30 PM Negative   Negative    08/13/2022  4:05 PM Negative    Negative    Negative   Negative    Negative    Negative    06/11/2022  4:25 PM Negative   Negative    04/17/2022  9:34 AM Negative    Negative    Negative   Negative    Negative    Negative    02/11/2022  4:27 PM Negative    Negative    Negative   Negative  Negative    Negative    12/11/2021  9:03 AM Negative   Negative    12/10/2021  4:07 PM Negative    Negative   Negative    Negative    10/17/2021  3:34 PM Negative   Negative    09/13/2021  3:21 PM Negative   Negative    08/20/2021  4:35 PM Negative    Positive   Negative    Negative    08/20/2021  3:48 PM Negative   Negative    06/21/2021  4:05 PM Negative   Negative    02/19/2021  9:56 AM Negative   Negative    02/19/2021  9:17 AM Negative   Negative    07/16/2016 10:45 AM  NOT DETECTED   NOT DETECTED   01/28/2016  8:50 AM  NOT DETECTED   NOT DETECTED   06/11/2015 10:12 AM  NOT DETECTED   NOT DETECTED     Hepatitis B Lab Results  Component Value Date   HEPBSAB POS (A) 06/11/2015   HEPBSAG NEGATIVE 05/29/2015   HEPBCAB NON REACTIVE 06/11/2015   Hepatitis C Lab Results  Component Value Date   HEPCAB NON-REACTIVE 08/21/2020   Hepatitis A Lab Results  Component Value Date   HAV REACTIVE (A) 08/20/2021   Lipids: Lab  Results  Component Value Date   CHOL 178 06/17/2017   TRIG 58 06/17/2017   HDL 46 06/17/2017   CHOLHDL 3.9 06/17/2017   VLDL 9 07/16/2016   LDLCALC 118 (H) 06/17/2017    TARGET DATE: The 9th of the month  Assessment: Roy Wheeler presents today for their Apretude injection and to follow up for HIV PrEP. No issues with past injections.  Screened patient for acute HIV symptoms such as fatigue, muscle aches, rash, sore throat, lymphadenopathy, headache, night sweats, nausea/vomiting/diarrhea, and fever. Patient denies any symptoms.   Per Roy Wheeler guidelines, a rapid HIV test should be drawn prior to Apretude administration. Due to state shortage of rapid HIV tests, this is temporarily unable to be done. Per decision from [facility] physicians, we will proceed with Apretude administration at this time without a negative rapid HIV test beforehand. HIV RNA was collected today and is in process.  Administered cabotegravir 600mg /69mL in right upper outer quadrant of the gluteal muscle. Will make follow up appointments for maintenance injections every 2 months.   No known exposures to any STIs and no signs or symptoms of any STIs today. Last STI screening was in August and was negative. No new partners since last injection; will check urine/oral/rectal cytologies today.  Due for annual flu and COVID vaccines which were both administered today.   Plan: - Administer Apretude 600 mg x 1  - Maintenance injections scheduled for 12/4 with me  - Check HIV RNA and urine/oral/rectal cytologies - Administer flu and COVID vaccines  - Call with any issues or questions  Roy Wheeler, PharmD, CPP, BCIDP, AAHIVP Clinical Pharmacist Practitioner Infectious Diseases Clinical Pharmacist [facility]

## 2022-12-10 ENCOUNTER — Other Ambulatory Visit (HOSPITAL_COMMUNITY)
Admission: RE | Admit: 2022-12-10 | Discharge: 2022-12-10 | Disposition: A | Discharge status: Home / Self Care | Payer: BC Managed Care – PPO | Source: Ambulatory Visit | Attending: Infectious Disease | Admitting: Infectious Disease

## 2022-12-10 ENCOUNTER — Ambulatory Visit: Payer: BC Managed Care – PPO | Admitting: Pharmacist

## 2022-12-10 ENCOUNTER — Other Ambulatory Visit: Payer: Self-pay

## 2022-12-10 DIAGNOSIS — Z113 Encounter for screening for infections with a predominantly sexual mode of transmission: Secondary | ICD-10-CM | POA: Insufficient documentation

## 2022-12-10 DIAGNOSIS — Z23 Encounter for immunization: Secondary | ICD-10-CM | POA: Diagnosis not present

## 2022-12-10 DIAGNOSIS — Z2981 Encounter for HIV pre-exposure prophylaxis: Secondary | ICD-10-CM

## 2022-12-10 DIAGNOSIS — Z79899 Other long term (current) drug therapy: Secondary | ICD-10-CM

## 2022-12-10 MED ORDER — CABOTEGRAVIR ER 600 MG/3ML IM SUER
600.0000 mg | Freq: Once | INTRAMUSCULAR | Status: AC
Start: 2022-12-10 — End: 2022-12-10
  Administered 2022-12-10: 600 mg via INTRAMUSCULAR

## 2022-12-12 LAB — HIV-1 RNA QUANT-NO REFLEX-BLD
HIV 1 RNA Quant: NOT DETECTED {copies}/mL
HIV-1 RNA Quant, Log: NOT DETECTED {Log}

## 2022-12-15 LAB — CYTOLOGY, (ORAL, ANAL, URETHRAL) ANCILLARY ONLY
Chlamydia: NEGATIVE
Chlamydia: NEGATIVE
Comment: NEGATIVE
Comment: NEGATIVE
Comment: NORMAL
Comment: NORMAL
Neisseria Gonorrhea: NEGATIVE
Neisseria Gonorrhea: NEGATIVE

## 2022-12-15 LAB — URINE CYTOLOGY ANCILLARY ONLY
Chlamydia: NEGATIVE
Comment: NEGATIVE
Comment: NORMAL
Neisseria Gonorrhea: NEGATIVE

## 2023-02-09 NOTE — Progress Notes (Unsigned)
HPI: Roy Wheeler is a 32 y.o. male who presents to the [facility] pharmacy clinic for Apretude administration and HIV PrEP follow up.  There are no problems to display for this patient.   Patient's Medications  New Prescriptions   No medications on file  Previous Medications   BENZONATATE (TESSALON) 100 MG CAPSULE    Take 1 capsule (100 mg total) by mouth every 8 (eight) hours.   CABOTEGRAVIR ER (APRETUDE) 600 MG/3ML INJECTION    Inject 3 mLs (600 mg total) into the muscle every 2 (two) months.   DICYCLOMINE (BENTYL) 20 MG TABLET    Take 1 tablet (20 mg total) by mouth 2 (two) times daily.   METRONIDAZOLE (FLAGYL) 500 MG TABLET    Take 1 tablet (500 mg total) by mouth 2 (two) times daily.   MULTIPLE VITAMIN (MULTIVITAMIN) CAPSULE    Take 1 capsule by mouth daily.   ONDANSETRON (ZOFRAN ODT) 4 MG DISINTEGRATING TABLET    Take 1 tablet (4 mg total) by mouth every 8 (eight) hours as needed for nausea or vomiting.   TRAMADOL (ULTRAM) 50 MG TABLET    Take 1 tablet (50 mg total) by mouth every 6 (six) hours as needed.  Modified Medications   No medications on file  Discontinued Medications   No medications on file    Allergies: No Known Allergies  Past Medical History: No past medical history on file.  Social History: Social History   Socioeconomic History   Marital status: Single    Spouse name: Not on file   Number of children: Not on file   Years of education: Not on file   Highest education level: Not on file  Occupational History   Not on file  Tobacco Use   Smoking status: Never   Smokeless tobacco: Never  Substance and Sexual Activity   Alcohol use: Yes    Comment: rarely   Drug use: No   Sexual activity: Not on file  Other Topics Concern   Not on file  Social History Narrative   Not on file   Social Determinants of Health   Financial Resource Strain: Not on file  Food Insecurity: Not on file  Transportation Needs: Not on file  Physical Activity: Not on file   Stress: Not on file  Social Connections: Not on file    Labs: Lab Results  Component Value Date   HIV1RNAQUANT Not Detected 12/10/2022   HIV1RNAQUANT Not Detected 10/09/2022   HIV1RNAQUANT Not Detected 08/13/2022    RPR and STI Lab Results  Component Value Date   LABRPR NON-REACTIVE 10/09/2022   LABRPR REACTIVE (A) 06/11/2022   LABRPR NON-REACTIVE 04/17/2022   LABRPR REACTIVE (A) 02/11/2022   LABRPR REACTIVE (A) 08/20/2021   RPRTITER 1:1 (H) 06/11/2022   RPRTITER 1:1 (H) 02/11/2022   RPRTITER 1:1 (H) 08/20/2021   RPRTITER 1:2 (H) 02/19/2021   RPRTITER 1:2 (H) 08/21/2020    STI Results GC GC CT CT  12/10/2022  9:16 AM Negative    Negative    Negative   Negative    Negative    Negative    10/09/2022  3:30 PM Negative   Negative    08/13/2022  4:05 PM Negative    Negative    Negative   Negative    Negative    Negative    06/11/2022  4:25 PM Negative   Negative    04/17/2022  9:34 AM Negative    Negative    Negative   Negative  Negative    Negative    02/11/2022  4:27 PM Negative    Negative    Negative   Negative    Negative    Negative    12/11/2021  9:03 AM Negative   Negative    12/10/2021  4:07 PM Negative    Negative   Negative    Negative    10/17/2021  3:34 PM Negative   Negative    09/13/2021  3:21 PM Negative   Negative    08/20/2021  4:35 PM Negative    Positive   Negative    Negative    08/20/2021  3:48 PM Negative   Negative    06/21/2021  4:05 PM Negative   Negative    02/19/2021  9:56 AM Negative   Negative    02/19/2021  9:17 AM Negative   Negative    07/16/2016 10:45 AM  NOT DETECTED   NOT DETECTED   01/28/2016  8:50 AM  NOT DETECTED   NOT DETECTED   06/11/2015 10:12 AM  NOT DETECTED   NOT DETECTED     Hepatitis B Lab Results  Component Value Date   HEPBSAB POS (A) 06/11/2015   HEPBSAG NEGATIVE 05/29/2015   HEPBCAB NON REACTIVE 06/11/2015   Hepatitis C Lab Results  Component Value Date   HEPCAB NON-REACTIVE 08/21/2020    Hepatitis A Lab Results  Component Value Date   HAV REACTIVE (A) 08/20/2021   Lipids: Lab Results  Component Value Date   CHOL 178 06/17/2017   TRIG 58 06/17/2017   HDL 46 06/17/2017   CHOLHDL 3.9 06/17/2017   VLDL 9 07/16/2016   LDLCALC 118 (H) 06/17/2017    TARGET DATE: The 9th of the month  Assessment: Roy Wheeler presents today for their Apretude injection and to follow up for HIV PrEP. No issues with past injections.  Screened patient for acute HIV symptoms such as fatigue, muscle aches, rash, sore throat, lymphadenopathy, headache, night sweats, nausea/vomiting/diarrhea, and fever. Patient denies any symptoms.   Per [employer] guidelines, a rapid HIV test should be drawn prior to Apretude administration. Due to state shortage of rapid HIV tests, this is temporarily unable to be done. Per decision from [facility] physicians, we will proceed with Apretude administration at this time without a negative rapid HIV test beforehand. HIV RNA was collected today and is in process.  Administered cabotegravir 600mg /77mL in right upper outer quadrant of the gluteal muscle. Will make follow up appointments for maintenance injections every 2 months.   No known exposures to any STIs and no signs or symptoms of any STIs today. Last STI screening was in October and was negative. No new partners since last injection; will check full STI testing today.   Plan: - Administer Apretude 600 mg x 1  - Maintenance injections scheduled for 2/4 with me  - Check HIV RNA, RPR, and urine/oral/rectal cytologies  - Call with any issues or questions  Margarite Gouge, PharmD, CPP, BCIDP, AAHIVP Clinical Pharmacist Practitioner Infectious Diseases Clinical Pharmacist [facility]

## 2023-02-11 ENCOUNTER — Other Ambulatory Visit: Payer: Self-pay

## 2023-02-11 ENCOUNTER — Other Ambulatory Visit (HOSPITAL_COMMUNITY)
Admission: RE | Admit: 2023-02-11 | Discharge: 2023-02-11 | Disposition: A | Discharge status: Home / Self Care | Payer: BC Managed Care – PPO | Source: Ambulatory Visit | Attending: Infectious Disease | Admitting: Infectious Disease

## 2023-02-11 ENCOUNTER — Ambulatory Visit (INDEPENDENT_AMBULATORY_CARE_PROVIDER_SITE_OTHER): Payer: BC Managed Care – PPO | Admitting: Pharmacist

## 2023-02-11 DIAGNOSIS — Z79899 Other long term (current) drug therapy: Secondary | ICD-10-CM

## 2023-02-11 DIAGNOSIS — Z113 Encounter for screening for infections with a predominantly sexual mode of transmission: Secondary | ICD-10-CM | POA: Diagnosis present

## 2023-02-11 DIAGNOSIS — Z2981 Encounter for HIV pre-exposure prophylaxis: Secondary | ICD-10-CM

## 2023-02-11 MED ORDER — CABOTEGRAVIR ER 600 MG/3ML IM SUER
600.0000 mg | Freq: Once | INTRAMUSCULAR | Status: AC
Start: 2023-02-11 — End: 2023-02-11
  Administered 2023-02-11: 600 mg via INTRAMUSCULAR

## 2023-02-13 LAB — RPR: RPR Ser Ql: NONREACTIVE

## 2023-02-13 LAB — URINE CYTOLOGY ANCILLARY ONLY
Chlamydia: NEGATIVE
Comment: NEGATIVE
Comment: NORMAL
Neisseria Gonorrhea: NEGATIVE

## 2023-02-13 LAB — CYTOLOGY, (ORAL, ANAL, URETHRAL) ANCILLARY ONLY
Chlamydia: NEGATIVE
Chlamydia: NEGATIVE
Comment: NEGATIVE
Comment: NEGATIVE
Comment: NORMAL
Comment: NORMAL
Neisseria Gonorrhea: NEGATIVE
Neisseria Gonorrhea: NEGATIVE

## 2023-02-13 LAB — HIV-1 RNA QUANT-NO REFLEX-BLD
HIV 1 RNA Quant: NOT DETECTED {copies}/mL
HIV-1 RNA Quant, Log: NOT DETECTED {Log_copies}/mL

## 2023-04-02 ENCOUNTER — Other Ambulatory Visit (HOSPITAL_COMMUNITY): Payer: Self-pay

## 2023-04-02 ENCOUNTER — Telehealth: Payer: Self-pay

## 2023-04-02 NOTE — Telephone Encounter (Addendum)
 error

## 2023-04-07 ENCOUNTER — Other Ambulatory Visit (HOSPITAL_COMMUNITY): Payer: Self-pay

## 2023-04-10 NOTE — Progress Notes (Deleted)
 HPI: Roy Wheeler is a 33 y.o. male who presents to the [facility] pharmacy clinic for Apretude  administration and HIV PrEP follow up.  There are no active problems to display for this patient.   Patient's Medications  New Prescriptions   No medications on file  Previous Medications   BENZONATATE  (TESSALON ) 100 MG CAPSULE    Take 1 capsule (100 mg total) by mouth every 8 (eight) hours.   CABOTEGRAVIR  ER (APRETUDE ) 600 MG/3ML INJECTION    Inject 3 mLs (600 mg total) into the muscle every 2 (two) months.   DICYCLOMINE  (BENTYL ) 20 MG TABLET    Take 1 tablet (20 mg total) by mouth 2 (two) times daily.   METRONIDAZOLE  (FLAGYL ) 500 MG TABLET    Take 1 tablet (500 mg total) by mouth 2 (two) times daily.   MULTIPLE VITAMIN (MULTIVITAMIN) CAPSULE    Take 1 capsule by mouth daily.   ONDANSETRON  (ZOFRAN  ODT) 4 MG DISINTEGRATING TABLET    Take 1 tablet (4 mg total) by mouth every 8 (eight) hours as needed for nausea or vomiting.   TRAMADOL  (ULTRAM ) 50 MG TABLET    Take 1 tablet (50 mg total) by mouth every 6 (six) hours as needed.  Modified Medications   No medications on file  Discontinued Medications   No medications on file    Allergies: No Known Allergies  Past Medical History: No past medical history on file.  Social History: Social History   Socioeconomic History   Marital status: Single    Spouse name: Not on file   Number of children: Not on file   Years of education: Not on file   Highest education level: Not on file  Occupational History   Not on file  Tobacco Use   Smoking status: Never   Smokeless tobacco: Never  Substance and Sexual Activity   Alcohol use: Yes    Comment: rarely   Drug use: No   Sexual activity: Not on file  Other Topics Concern   Not on file  Social History Narrative   Not on file   Social Drivers of Health   Financial Resource Strain: Not on file  Food Insecurity: Not on file  Transportation Needs: Not on file  Physical Activity: Not on  file  Stress: Not on file  Social Connections: Not on file    Labs: Lab Results  Component Value Date   HIV1RNAQUANT Not Detected 02/11/2023   HIV1RNAQUANT Not Detected 12/10/2022   HIV1RNAQUANT Not Detected 10/09/2022    RPR and STI Lab Results  Component Value Date   LABRPR NON-REACTIVE 02/11/2023   LABRPR NON-REACTIVE 10/09/2022   LABRPR REACTIVE (A) 06/11/2022   LABRPR NON-REACTIVE 04/17/2022   LABRPR REACTIVE (A) 02/11/2022   RPRTITER 1:1 (H) 06/11/2022   RPRTITER 1:1 (H) 02/11/2022   RPRTITER 1:1 (H) 08/20/2021   RPRTITER 1:2 (H) 02/19/2021   RPRTITER 1:2 (H) 08/21/2020    STI Results GC GC CT CT  02/11/2023  9:21 AM Negative    Negative    Negative   Negative    Negative    Negative    12/10/2022  9:16 AM Negative    Negative    Negative   Negative    Negative    Negative    10/09/2022  3:30 PM Negative   Negative    08/13/2022  4:05 PM Negative    Negative    Negative   Negative    Negative    Negative  06/11/2022  4:25 PM Negative   Negative    04/17/2022  9:34 AM Negative    Negative    Negative   Negative    Negative    Negative    02/11/2022  4:27 PM Negative    Negative    Negative   Negative    Negative    Negative    12/11/2021  9:03 AM Negative   Negative    12/10/2021  4:07 PM Negative    Negative   Negative    Negative    10/17/2021  3:34 PM Negative   Negative    09/13/2021  3:21 PM Negative   Negative    08/20/2021  4:35 PM Negative    Positive   Negative    Negative    08/20/2021  3:48 PM Negative   Negative    06/21/2021  4:05 PM Negative   Negative    02/19/2021  9:56 AM Negative   Negative    02/19/2021  9:17 AM Negative   Negative    07/16/2016 10:45 AM  NOT DETECTED   NOT DETECTED   01/28/2016  8:50 AM  NOT DETECTED   NOT DETECTED   06/11/2015 10:12 AM  NOT DETECTED   NOT DETECTED     Hepatitis B Lab Results  Component Value Date   HEPBSAB POS (A) 06/11/2015   HEPBSAG NEGATIVE 05/29/2015   HEPBCAB NON REACTIVE  06/11/2015   Hepatitis C Lab Results  Component Value Date   HEPCAB NON-REACTIVE 08/21/2020   Hepatitis A Lab Results  Component Value Date   HAV REACTIVE (A) 08/20/2021   Lipids: Lab Results  Component Value Date   CHOL 178 06/17/2017   TRIG 58 06/17/2017   HDL 46 06/17/2017   CHOLHDL 3.9 06/17/2017   VLDL 9 07/16/2016   LDLCALC 118 (H) 06/17/2017    TARGET DATE: The 9th of the month  Assessment: Giovanny presents today for their Apretude  injection and to follow up for HIV PrEP. No issues with past injections.  Screened patient for acute HIV symptoms such as fatigue, muscle aches, rash, sore throat, lymphadenopathy, headache, night sweats, nausea/vomiting/diarrhea, and fever. Patient denies any symptoms.   Per [employer] guidelines, a rapid HIV test should be drawn prior to Apretude  administration. Due to state shortage of rapid HIV tests, this is temporarily unable to be done. Per decision from [facility] physicians, we will proceed with Apretude  administration at this time without a negative rapid HIV test beforehand. HIV RNA was collected today and is in process.  Administered cabotegravir  600mg /91mL in *** upper outer quadrant of the gluteal muscle. Will make follow up appointments for maintenance injections every 2 months.   No known exposures to any STIs and no signs or symptoms of any STIs today. Last STI screening was in December and was negative. *** new partners since last injection; will check *** today.   Plan: - Administer Apretude  600 mg x 1  - Maintenance injections scheduled for *** - Check HIV RNA and *** - Call with any issues or questions  Alan Geralds, PharmD, CPP, BCIDP, AAHIVP Clinical Pharmacist Practitioner Infectious Diseases Clinical Pharmacist [facility]

## 2023-04-14 ENCOUNTER — Ambulatory Visit: Payer: 59 | Admitting: Pharmacist

## 2023-04-15 ENCOUNTER — Ambulatory Visit: Payer: 59 | Admitting: Pharmacist

## 2023-04-15 DIAGNOSIS — Z113 Encounter for screening for infections with a predominantly sexual mode of transmission: Secondary | ICD-10-CM

## 2023-04-15 DIAGNOSIS — Z79899 Other long term (current) drug therapy: Secondary | ICD-10-CM

## 2023-04-21 ENCOUNTER — Other Ambulatory Visit (HOSPITAL_COMMUNITY): Payer: Self-pay

## 2023-04-22 ENCOUNTER — Ambulatory Visit (INDEPENDENT_AMBULATORY_CARE_PROVIDER_SITE_OTHER): Payer: 59 | Admitting: Pharmacist

## 2023-04-22 ENCOUNTER — Other Ambulatory Visit (HOSPITAL_COMMUNITY): Payer: Self-pay

## 2023-04-22 ENCOUNTER — Other Ambulatory Visit (HOSPITAL_COMMUNITY)
Admission: RE | Admit: 2023-04-22 | Discharge: 2023-04-22 | Disposition: A | Discharge status: Home / Self Care | Payer: 59 | Source: Ambulatory Visit | Attending: Infectious Disease | Admitting: Infectious Disease

## 2023-04-22 ENCOUNTER — Other Ambulatory Visit: Payer: Self-pay

## 2023-04-22 DIAGNOSIS — Z113 Encounter for screening for infections with a predominantly sexual mode of transmission: Secondary | ICD-10-CM | POA: Insufficient documentation

## 2023-04-22 DIAGNOSIS — Z79899 Other long term (current) drug therapy: Secondary | ICD-10-CM

## 2023-04-22 DIAGNOSIS — Z2981 Encounter for HIV pre-exposure prophylaxis: Secondary | ICD-10-CM | POA: Diagnosis not present

## 2023-04-22 MED ORDER — APRETUDE 600 MG/3ML IM SUER: 600.0000 mg | INTRAMUSCULAR | 5 refills | Status: DC

## 2023-04-22 MED ORDER — CABOTEGRAVIR ER 600 MG/3ML IM SUER
600.0000 mg | Freq: Once | INTRAMUSCULAR | Status: AC
  Administered 2023-04-22: 600 mg via INTRAMUSCULAR

## 2023-04-22 NOTE — Progress Notes (Signed)
HPI: Roy Wheeler is a 33 y.o. male who presents to the [facility] pharmacy clinic for Apretude administration and HIV PrEP follow up.  There are no active problems to display for this patient.   Patient's Medications  New Prescriptions   No medications on file  Previous Medications   BENZONATATE (TESSALON) 100 MG CAPSULE    Take 1 capsule (100 mg total) by mouth every 8 (eight) hours.   CABOTEGRAVIR ER (APRETUDE) 600 MG/3ML INJECTION    Inject 3 mLs (600 mg total) into the muscle every 2 (two) months.   DICYCLOMINE (BENTYL) 20 MG TABLET    Take 1 tablet (20 mg total) by mouth 2 (two) times daily.   METRONIDAZOLE (FLAGYL) 500 MG TABLET    Take 1 tablet (500 mg total) by mouth 2 (two) times daily.   MULTIPLE VITAMIN (MULTIVITAMIN) CAPSULE    Take 1 capsule by mouth daily.   ONDANSETRON (ZOFRAN ODT) 4 MG DISINTEGRATING TABLET    Take 1 tablet (4 mg total) by mouth every 8 (eight) hours as needed for nausea or vomiting.   TRAMADOL (ULTRAM) 50 MG TABLET    Take 1 tablet (50 mg total) by mouth every 6 (six) hours as needed.  Modified Medications   No medications on file  Discontinued Medications   No medications on file    Allergies: No Known Allergies  Past Medical History: No past medical history on file.  Social History: Social History   Socioeconomic History   Marital status: Single    Spouse name: Not on file   Number of children: Not on file   Years of education: Not on file   Highest education level: Not on file  Occupational History   Not on file  Tobacco Use   Smoking status: Never   Smokeless tobacco: Never  Substance and Sexual Activity   Alcohol use: Yes    Comment: rarely   Drug use: No   Sexual activity: Not on file  Other Topics Concern   Not on file  Social History Narrative   Not on file   Social Drivers of Health   Financial Resource Strain: Not on file  Food Insecurity: Not on file  Transportation Needs: Not on file  Physical Activity: Not on  file  Stress: Not on file  Social Connections: Not on file    Labs: Lab Results  Component Value Date   HIV1RNAQUANT Not Detected 02/11/2023   HIV1RNAQUANT Not Detected 12/10/2022   HIV1RNAQUANT Not Detected 10/09/2022    RPR and STI Lab Results  Component Value Date   LABRPR NON-REACTIVE 02/11/2023   LABRPR NON-REACTIVE 10/09/2022   LABRPR REACTIVE (A) 06/11/2022   LABRPR NON-REACTIVE 04/17/2022   LABRPR REACTIVE (A) 02/11/2022   RPRTITER 1:1 (H) 06/11/2022   RPRTITER 1:1 (H) 02/11/2022   RPRTITER 1:1 (H) 08/20/2021   RPRTITER 1:2 (H) 02/19/2021   RPRTITER 1:2 (H) 08/21/2020    STI Results GC GC CT CT  02/11/2023  9:21 AM Negative    Negative    Negative   Negative    Negative    Negative    12/10/2022  9:16 AM Negative    Negative    Negative   Negative    Negative    Negative    10/09/2022  3:30 PM Negative   Negative    08/13/2022  4:05 PM Negative    Negative    Negative   Negative    Negative    Negative  06/11/2022  4:25 PM Negative   Negative    04/17/2022  9:34 AM Negative    Negative    Negative   Negative    Negative    Negative    02/11/2022  4:27 PM Negative    Negative    Negative   Negative    Negative    Negative    12/11/2021  9:03 AM Negative   Negative    12/10/2021  4:07 PM Negative    Negative   Negative    Negative    10/17/2021  3:34 PM Negative   Negative    09/13/2021  3:21 PM Negative   Negative    08/20/2021  4:35 PM Negative    Positive   Negative    Negative    08/20/2021  3:48 PM Negative   Negative    06/21/2021  4:05 PM Negative   Negative    02/19/2021  9:56 AM Negative   Negative    02/19/2021  9:17 AM Negative   Negative    07/16/2016 10:45 AM  NOT DETECTED   NOT DETECTED   01/28/2016  8:50 AM  NOT DETECTED   NOT DETECTED   06/11/2015 10:12 AM  NOT DETECTED   NOT DETECTED     Hepatitis B Lab Results  Component Value Date   HEPBSAB POS (A) 06/11/2015   HEPBSAG NEGATIVE 05/29/2015   HEPBCAB NON REACTIVE  06/11/2015   Hepatitis C Lab Results  Component Value Date   HEPCAB NON-REACTIVE 08/21/2020   Hepatitis A Lab Results  Component Value Date   HAV REACTIVE (A) 08/20/2021   Lipids: Lab Results  Component Value Date   CHOL 178 06/17/2017   TRIG 58 06/17/2017   HDL 46 06/17/2017   CHOLHDL 3.9 06/17/2017   VLDL 9 07/16/2016   LDLCALC 118 (H) 06/17/2017    TARGET DATE: June 17, 2023  Assessment: Roy Wheeler presents today for their Apretude injection and to follow up for HIV PrEP. No issues with past injections. Screened patient for acute HIV symptoms such as fatigue, muscle aches, rash, sore throat, lymphadenopathy, headache, night sweats, nausea/vomiting/diarrhea, and fever. Patient denies any symptoms other than some flu symptoms that seems to be improving.   Per [employer] guidelines, a rapid HIV test should be drawn prior to Apretude administration. Due to state shortage of rapid HIV tests, this is temporarily unable to be done. Per decision from [facility] physicians, we will proceed with Apretude administration at this time without a negative rapid HIV test beforehand. HIV RNA was collected today and is in process.  Administered cabotegravir 600mg /63mL in right upper outer quadrant of the gluteal muscle. Will make follow up appointments for maintenance injections every 2 months.   No known exposures to any STIs and no signs or symptoms of any STIs today. Last STI screening was in December and was negative. He agreed to STI screening today. No new partners since last injection; will check HIV RNA today.   Plan: - Administer Apretude 600 mg x 1  - Maintenance injections scheduled for 06/11/2023. - Check HIV RNA - STI screening (urine/oral/rectal) test today - Call with any issues or questions  Buddy Duty, PharmD Student [facility]

## 2023-04-23 LAB — CYTOLOGY, (ORAL, ANAL, URETHRAL) ANCILLARY ONLY
Chlamydia: NEGATIVE
Chlamydia: NEGATIVE
Comment: NEGATIVE
Comment: NEGATIVE
Comment: NORMAL
Comment: NORMAL
Neisseria Gonorrhea: NEGATIVE
Neisseria Gonorrhea: NEGATIVE

## 2023-04-23 LAB — URINE CYTOLOGY ANCILLARY ONLY
Chlamydia: NEGATIVE
Comment: NEGATIVE
Comment: NORMAL
Neisseria Gonorrhea: NEGATIVE

## 2023-04-25 LAB — HIV-1 RNA QUANT-NO REFLEX-BLD
HIV 1 RNA Quant: NOT DETECTED {copies}/mL
HIV-1 RNA Quant, Log: NOT DETECTED {Log}

## 2023-04-25 LAB — RPR: RPR Ser Ql: NONREACTIVE

## 2023-05-01 ENCOUNTER — Telehealth: Payer: Self-pay

## 2023-05-01 NOTE — Telephone Encounter (Signed)
[facility] Patient Advocate Encounter  Patient's medications Apretude have been couriered to [facility] from [facility] and was administered at the patients appointment on 04/22/23.  Clearance Coots, CPhT Special[facility] Pharmacy Patient [facility] Phone: [phone] Fax:  [phone]

## 2023-06-08 NOTE — Progress Notes (Unsigned)
 HPI: Roy Wheeler is a 33 y.o. male who presents to the [facility] pharmacy clinic for Apretude administration and HIV PrEP follow up.  There are no active problems to display for this patient.   Patient's Medications  New Prescriptions   No medications on file  Previous Medications   BENZONATATE (TESSALON) 100 MG CAPSULE    Take 1 capsule (100 mg total) by mouth every 8 (eight) hours.   CABOTEGRAVIR ER (APRETUDE) 600 MG/3ML INJECTION    Inject 3 mLs (600 mg total) into the muscle every 2 (two) months.   DICYCLOMINE (BENTYL) 20 MG TABLET    Take 1 tablet (20 mg total) by mouth 2 (two) times daily.   METRONIDAZOLE (FLAGYL) 500 MG TABLET    Take 1 tablet (500 mg total) by mouth 2 (two) times daily.   MULTIPLE VITAMIN (MULTIVITAMIN) CAPSULE    Take 1 capsule by mouth daily.   ONDANSETRON (ZOFRAN ODT) 4 MG DISINTEGRATING TABLET    Take 1 tablet (4 mg total) by mouth every 8 (eight) hours as needed for nausea or vomiting.   TRAMADOL (ULTRAM) 50 MG TABLET    Take 1 tablet (50 mg total) by mouth every 6 (six) hours as needed.  Modified Medications   No medications on file  Discontinued Medications   No medications on file    Allergies: No Known Allergies  Past Medical History: No past medical history on file.  Social History: Social History   Socioeconomic History   Marital status: Single    Spouse name: Not on file   Number of children: Not on file   Years of education: Not on file   Highest education level: Not on file  Occupational History   Not on file  Tobacco Use   Smoking status: Never   Smokeless tobacco: Never  Substance and Sexual Activity   Alcohol use: Yes    Comment: rarely   Drug use: No   Sexual activity: Not on file  Other Topics Concern   Not on file  Social History Narrative   Not on file   Social Drivers of Health   Financial Resource Strain: Not on file  Food Insecurity: Not on file  Transportation Needs: Not on file  Physical Activity: Not on  file  Stress: Not on file  Social Connections: Not on file    Labs: Lab Results  Component Value Date   HIV1RNAQUANT Not Detected 04/22/2023   HIV1RNAQUANT Not Detected 02/11/2023   HIV1RNAQUANT Not Detected 12/10/2022    RPR and STI Lab Results  Component Value Date   LABRPR NON-REACTIVE 04/22/2023   LABRPR NON-REACTIVE 02/11/2023   LABRPR NON-REACTIVE 10/09/2022   LABRPR REACTIVE (A) 06/11/2022   LABRPR NON-REACTIVE 04/17/2022   RPRTITER 1:1 (H) 06/11/2022   RPRTITER 1:1 (H) 02/11/2022   RPRTITER 1:1 (H) 08/20/2021   RPRTITER 1:2 (H) 02/19/2021   RPRTITER 1:2 (H) 08/21/2020    STI Results GC GC CT CT  04/22/2023 10:04 AM Negative    Negative    Negative   Negative    Negative    Negative    02/11/2023  9:21 AM Negative    Negative    Negative   Negative    Negative    Negative    12/10/2022  9:16 AM Negative    Negative    Negative   Negative    Negative    Negative    10/09/2022  3:30 PM Negative   Negative    08/13/2022  4:05 PM Negative    Negative    Negative   Negative    Negative    Negative    06/11/2022  4:25 PM Negative   Negative    04/17/2022  9:34 AM Negative    Negative    Negative   Negative    Negative    Negative    02/11/2022  4:27 PM Negative    Negative    Negative   Negative    Negative    Negative    12/11/2021  9:03 AM Negative   Negative    12/10/2021  4:07 PM Negative    Negative   Negative    Negative    10/17/2021  3:34 PM Negative   Negative    09/13/2021  3:21 PM Negative   Negative    08/20/2021  4:35 PM Negative    Positive   Negative    Negative    08/20/2021  3:48 PM Negative   Negative    06/21/2021  4:05 PM Negative   Negative    02/19/2021  9:56 AM Negative   Negative    02/19/2021  9:17 AM Negative   Negative    07/16/2016 10:45 AM  NOT DETECTED   NOT DETECTED   01/28/2016  8:50 AM  NOT DETECTED   NOT DETECTED   06/11/2015 10:12 AM  NOT DETECTED   NOT DETECTED     Hepatitis B Lab Results  Component  Value Date   HEPBSAB POS (A) 06/11/2015   HEPBSAG NEGATIVE 05/29/2015   HEPBCAB NON REACTIVE 06/11/2015   Hepatitis C Lab Results  Component Value Date   HEPCAB NON-REACTIVE 08/21/2020   Hepatitis A Lab Results  Component Value Date   HAV REACTIVE (A) 08/20/2021   Lipids: Lab Results  Component Value Date   CHOL 178 06/17/2017   TRIG 58 06/17/2017   HDL 46 06/17/2017   CHOLHDL 3.9 06/17/2017   VLDL 9 07/16/2016   LDLCALC 118 (H) 06/17/2017    TARGET DATE: The 9th of the month  Assessment: Orange presents today for their Apretude injection and to follow up for HIV PrEP. No issues with past injections.  Screened patient for acute HIV symptoms such as fatigue, muscle aches, rash, sore throat, lymphadenopathy, headache, night sweats, nausea/vomiting/diarrhea, and fever. Patient denies any symptoms.   Per [employer] guidelines, a rapid HIV test should be drawn prior to Apretude administration. Due to state shortage of rapid HIV tests, this is temporarily unable to be done. Per decision from [facility] physicians, we will proceed with Apretude administration at this time without a negative rapid HIV test beforehand. HIV RNA was collected today and is in process.  Administered cabotegravir 600mg /60mL in right upper outer quadrant of the gluteal muscle. Will make follow up appointments for maintenance injections every 2 months.   No known exposures to any STIs and no signs or symptoms of any STIs today. Last STI screening was in February and was negative. No new partners since last injection; will check routine STI testing with RPR and urine/oral/rectal cytologies today.   Plan: - Administer Apretude 600 mg x 1  - Maintenance injections scheduled for 6/4 and 8/5 with me  - Check HIV RNA, RPR, and urine/oral/rectal cytologies  - Call with any issues or questions  Margarite Gouge, PharmD, CPP, BCIDP, AAHIVP Clinical Pharmacist Practitioner Infectious Diseases Clinical  Pharmacist [facility]

## 2023-06-10 ENCOUNTER — Telehealth: Payer: Self-pay

## 2023-06-10 NOTE — Telephone Encounter (Signed)
 [facility] Patient Advocate Encounter  Patient's medications Apretude have been couriered to [facility] from [facility] and will be administered at the patients appointment on 06/11/23.  Clearance Roy Wheeler, Roy Wheeler [facility] [name] Phone: [phone] Fax:  [phone]

## 2023-06-11 ENCOUNTER — Other Ambulatory Visit: Payer: Self-pay

## 2023-06-11 ENCOUNTER — Other Ambulatory Visit (HOSPITAL_COMMUNITY)
Admission: RE | Admit: 2023-06-11 | Discharge: 2023-06-11 | Disposition: A | Discharge status: Home / Self Care | Source: Ambulatory Visit | Attending: Infectious Disease | Admitting: Infectious Disease

## 2023-06-11 ENCOUNTER — Ambulatory Visit: Payer: 59 | Admitting: Pharmacist

## 2023-06-11 DIAGNOSIS — Z2981 Encounter for HIV pre-exposure prophylaxis: Secondary | ICD-10-CM | POA: Diagnosis not present

## 2023-06-11 DIAGNOSIS — Z113 Encounter for screening for infections with a predominantly sexual mode of transmission: Secondary | ICD-10-CM

## 2023-06-11 DIAGNOSIS — Z79899 Other long term (current) drug therapy: Secondary | ICD-10-CM

## 2023-06-11 MED ORDER — CABOTEGRAVIR ER 600 MG/3ML IM SUER
600.0000 mg | Freq: Once | INTRAMUSCULAR | Status: AC
  Administered 2023-06-11: 600 mg via INTRAMUSCULAR

## 2023-06-11 NOTE — Addendum Note (Signed)
 Addended by: Juanita Laster on: 06/11/2023 09:43 AM   Modules accepted: Orders

## 2023-06-12 LAB — URINE CYTOLOGY ANCILLARY ONLY
Chlamydia: NEGATIVE
Comment: NEGATIVE
Comment: NORMAL
Neisseria Gonorrhea: NEGATIVE

## 2023-06-12 LAB — CYTOLOGY, (ORAL, ANAL, URETHRAL) ANCILLARY ONLY
Chlamydia: NEGATIVE
Chlamydia: NEGATIVE
Comment: NEGATIVE
Comment: NEGATIVE
Comment: NORMAL
Comment: NORMAL
Neisseria Gonorrhea: NEGATIVE
Neisseria Gonorrhea: NEGATIVE

## 2023-06-13 LAB — HIV-1 RNA QUANT-NO REFLEX-BLD
HIV 1 RNA Quant: NOT DETECTED {copies}/mL
HIV-1 RNA Quant, Log: NOT DETECTED {Log_copies}/mL

## 2023-06-13 LAB — RPR: RPR Ser Ql: NONREACTIVE

## 2023-08-07 ENCOUNTER — Telehealth: Payer: Self-pay

## 2023-08-07 NOTE — Telephone Encounter (Signed)
 [facility] Patient Advocate Encounter  Patient's medications Apretude  have been couriered to [facility] from [facility] and will be administered at the patients appointment on 06/04.  Roylene Corn, CPhT Specialty Pharmacy Patient [facility] for Infectious Disease Phone: [phone] Fax:  [phone]

## 2023-08-11 NOTE — Progress Notes (Unsigned)
 HPI: Roy Wheeler is a 33 y.o. male who presents to the [facility] pharmacy clinic for Apretude  administration and HIV PrEP follow up.  Insured   [x]    Uninsured  []    There are no active problems to display for this patient.   Patient's Medications  New Prescriptions   No medications on file  Previous Medications   BENZONATATE  (TESSALON ) 100 MG CAPSULE    Take 1 capsule (100 mg total) by mouth every 8 (eight) hours.   CABOTEGRAVIR  ER (APRETUDE ) 600 MG/3ML INJECTION    Inject 3 mLs (600 mg total) into the muscle every 2 (two) months.   DICYCLOMINE  (BENTYL ) 20 MG TABLET    Take 1 tablet (20 mg total) by mouth 2 (two) times daily.   METRONIDAZOLE  (FLAGYL ) 500 MG TABLET    Take 1 tablet (500 mg total) by mouth 2 (two) times daily.   MULTIPLE VITAMIN (MULTIVITAMIN) CAPSULE    Take 1 capsule by mouth daily.   ONDANSETRON  (ZOFRAN  ODT) 4 MG DISINTEGRATING TABLET    Take 1 tablet (4 mg total) by mouth every 8 (eight) hours as needed for nausea or vomiting.   TRAMADOL  (ULTRAM ) 50 MG TABLET    Take 1 tablet (50 mg total) by mouth every 6 (six) hours as needed.  Modified Medications   No medications on file  Discontinued Medications   No medications on file    Allergies: No Known Allergies  Labs: Lab Results  Component Value Date   HIV1RNAQUANT NOT DETECTED 06/11/2023   HIV1RNAQUANT Not Detected 04/22/2023   HIV1RNAQUANT Not Detected 02/11/2023    RPR and STI Lab Results  Component Value Date   LABRPR NON-REACTIVE 06/11/2023   LABRPR NON-REACTIVE 04/22/2023   LABRPR NON-REACTIVE 02/11/2023   LABRPR NON-REACTIVE 10/09/2022   LABRPR REACTIVE (A) 06/11/2022   RPRTITER 1:1 (H) 06/11/2022   RPRTITER 1:1 (H) 02/11/2022   RPRTITER 1:1 (H) 08/20/2021   RPRTITER 1:2 (H) 02/19/2021   RPRTITER 1:2 (H) 08/21/2020    STI Results GC GC CT CT  06/11/2023  9:46 AM Negative    Negative    Negative   Negative    Negative    Negative    04/22/2023 10:04 AM Negative    Negative    Negative    Negative    Negative    Negative    02/11/2023  9:21 AM Negative    Negative    Negative   Negative    Negative    Negative    12/10/2022  9:16 AM Negative    Negative    Negative   Negative    Negative    Negative    10/09/2022  3:30 PM Negative   Negative    08/13/2022  4:05 PM Negative    Negative    Negative   Negative    Negative    Negative    06/11/2022  4:25 PM Negative   Negative    04/17/2022  9:34 AM Negative    Negative    Negative   Negative    Negative    Negative    02/11/2022  4:27 PM Negative    Negative    Negative   Negative    Negative    Negative    12/11/2021  9:03 AM Negative   Negative    12/10/2021  4:07 PM Negative    Negative   Negative    Negative    10/17/2021  3:34 PM Negative   Negative  09/13/2021  3:21 PM Negative   Negative    08/20/2021  4:35 PM Negative    Positive   Negative    Negative    08/20/2021  3:48 PM Negative   Negative    06/21/2021  4:05 PM Negative   Negative    02/19/2021  9:56 AM Negative   Negative    02/19/2021  9:17 AM Negative   Negative    07/16/2016 10:45 AM  NOT DETECTED   NOT DETECTED   01/28/2016  8:50 AM  NOT DETECTED   NOT DETECTED     Hepatitis B Lab Results  Component Value Date   HEPBSAB POS (A) 06/11/2015   HEPBSAG NEGATIVE 05/29/2015   HEPBCAB NON REACTIVE 06/11/2015   Hepatitis C Lab Results  Component Value Date   HEPCAB NON-REACTIVE 08/21/2020   Hepatitis A Lab Results  Component Value Date   HAV REACTIVE (A) 08/20/2021   Lipids: Lab Results  Component Value Date   CHOL 178 06/17/2017   TRIG 58 06/17/2017   HDL 46 06/17/2017   CHOLHDL 3.9 06/17/2017   VLDL 9 07/16/2016   LDLCALC 118 (H) 06/17/2017    TARGET DATE: The 9th of each month  Assessment: Roy Wheeler presents today for maintenance Apretude  injection and to follow up for HIV PrEP. No issues with past injections. Denies any symptoms of acute HIV. Last STI screening was on 06/11/2023 and was negative. No known  exposures to any STIs since last visit. Roy Wheeler agrees to STI testing today.    Per [employer] guidelines, a rapid HIV test should be drawn prior to Apretude  administration. Due to state shortage of rapid HIV tests, this is temporarily unable to be done. Per decision from [facility] physicians, we will proceed with Apretude  administration at this time without a negative rapid HIV test beforehand. HIV RNA was collected today and is in process.  Administered cabotegravir  600mg /50mL in RIGHT upper outer quadrant of the gluteal muscle. Will see Roy Wheeler back in 2 months for injection, labs, and HIV PrEP follow up.  Plan: - Apretude  injection administered - HIV RNA today -He defers STI testing today - Next injection, labs, and PrEP follow up appointment scheduled for 10/13/2023 - Call with any issues or questions  Tolu Wyatt Thorstenson, PharmD [facility] PGY-1

## 2023-08-12 ENCOUNTER — Ambulatory Visit (INDEPENDENT_AMBULATORY_CARE_PROVIDER_SITE_OTHER): Admitting: Pharmacist

## 2023-08-12 ENCOUNTER — Other Ambulatory Visit: Payer: Self-pay

## 2023-08-12 DIAGNOSIS — Z79899 Other long term (current) drug therapy: Secondary | ICD-10-CM

## 2023-08-12 DIAGNOSIS — Z2981 Encounter for HIV pre-exposure prophylaxis: Secondary | ICD-10-CM

## 2023-08-12 DIAGNOSIS — Z113 Encounter for screening for infections with a predominantly sexual mode of transmission: Secondary | ICD-10-CM

## 2023-08-12 MED ORDER — CABOTEGRAVIR ER 600 MG/3ML IM SUER
600.0000 mg | Freq: Once | INTRAMUSCULAR | Status: AC
  Administered 2023-08-12: 600 mg via INTRAMUSCULAR

## 2023-08-12 NOTE — Addendum Note (Signed)
 Addended by: Sonya Duster on: 08/12/2023 09:38 AM   Modules accepted: Orders

## 2023-08-14 LAB — HIV-1 RNA QUANT-NO REFLEX-BLD
HIV 1 RNA Quant: NOT DETECTED {copies}/mL
HIV-1 RNA Quant, Log: NOT DETECTED {Log_copies}/mL

## 2023-10-07 ENCOUNTER — Telehealth: Payer: Self-pay

## 2023-10-07 NOTE — Telephone Encounter (Signed)
 [facility] Patient Advocate Encounter  Patient's medications APRETUDE  have been couriered to [facility] from [facility] and will be administered at the patients appointment on 10/13/23.  Charmaine Sharps, CPhT Specialty Pharmacy Patient [facility] for Infectious Disease Phone: [phone] Fax:  [phone]

## 2023-10-09 NOTE — Progress Notes (Signed)
 HPI: Roy Wheeler is a 33 y.o. male who presents to the [facility] pharmacy clinic for Apretude  administration and HIV PrEP follow up.  There are no active problems to display for this patient.   Patient's Medications  New Prescriptions   No medications on file  Previous Medications   BENZONATATE  (TESSALON ) 100 MG CAPSULE    Take 1 capsule (100 mg total) by mouth every 8 (eight) hours.   CABOTEGRAVIR  ER (APRETUDE ) 600 MG/3ML INJECTION    Inject 3 mLs (600 mg total) into the muscle every 2 (two) months.   DICYCLOMINE  (BENTYL ) 20 MG TABLET    Take 1 tablet (20 mg total) by mouth 2 (two) times daily.   METRONIDAZOLE  (FLAGYL ) 500 MG TABLET    Take 1 tablet (500 mg total) by mouth 2 (two) times daily.   MULTIPLE VITAMIN (MULTIVITAMIN) CAPSULE    Take 1 capsule by mouth daily.   ONDANSETRON  (ZOFRAN  ODT) 4 MG DISINTEGRATING TABLET    Take 1 tablet (4 mg total) by mouth every 8 (eight) hours as needed for nausea or vomiting.   TRAMADOL  (ULTRAM ) 50 MG TABLET    Take 1 tablet (50 mg total) by mouth every 6 (six) hours as needed.  Modified Medications   No medications on file  Discontinued Medications   No medications on file    Allergies: No Known Allergies  Past Medical History: No past medical history on file.  Social History: Social History   Socioeconomic History   Marital status: Single    Spouse name: Not on file   Number of children: Not on file   Years of education: Not on file   Highest education level: Not on file  Occupational History   Not on file  Tobacco Use   Smoking status: Never   Smokeless tobacco: Never  Substance and Sexual Activity   Alcohol use: Yes    Comment: rarely   Drug use: No   Sexual activity: Not on file  Other Topics Concern   Not on file  Social History Narrative   Not on file   Social Drivers of Health   Financial Resource Strain: Not on file  Food Insecurity: Not on file  Transportation Needs: Not on file  Physical Activity: Not on  file  Stress: Not on file  Social Connections: Not on file    Labs: Lab Results  Component Value Date   HIV1RNAQUANT NOT DETECTED 08/12/2023   HIV1RNAQUANT NOT DETECTED 06/11/2023   HIV1RNAQUANT Not Detected 04/22/2023    RPR and STI Lab Results  Component Value Date   LABRPR NON-REACTIVE 06/11/2023   LABRPR NON-REACTIVE 04/22/2023   LABRPR NON-REACTIVE 02/11/2023   LABRPR NON-REACTIVE 10/09/2022   LABRPR REACTIVE (A) 06/11/2022   RPRTITER 1:1 (H) 06/11/2022   RPRTITER 1:1 (H) 02/11/2022   RPRTITER 1:1 (H) 08/20/2021   RPRTITER 1:2 (H) 02/19/2021   RPRTITER 1:2 (H) 08/21/2020    STI Results GC GC CT CT  06/11/2023  9:46 AM Negative    Negative    Negative   Negative    Negative    Negative    04/22/2023 10:04 AM Negative    Negative    Negative   Negative    Negative    Negative    02/11/2023  9:21 AM Negative    Negative    Negative   Negative    Negative    Negative    12/10/2022  9:16 AM Negative    Negative    Negative  Negative    Negative    Negative    10/09/2022  3:30 PM Negative   Negative    08/13/2022  4:05 PM Negative    Negative    Negative   Negative    Negative    Negative    06/11/2022  4:25 PM Negative   Negative    04/17/2022  9:34 AM Negative    Negative    Negative   Negative    Negative    Negative    02/11/2022  4:27 PM Negative    Negative    Negative   Negative    Negative    Negative    12/11/2021  9:03 AM Negative   Negative    12/10/2021  4:07 PM Negative    Negative   Negative    Negative    10/17/2021  3:34 PM Negative   Negative    09/13/2021  3:21 PM Negative   Negative    08/20/2021  4:35 PM Negative    Positive   Negative    Negative    08/20/2021  3:48 PM Negative   Negative    06/21/2021  4:05 PM Negative   Negative    02/19/2021  9:56 AM Negative   Negative    02/19/2021  9:17 AM Negative   Negative    07/16/2016 10:45 AM  NOT DETECTED   NOT DETECTED   01/28/2016  8:50 AM  NOT DETECTED   NOT DETECTED      Hepatitis B Lab Results  Component Value Date   HEPBSAB POS (A) 06/11/2015   HEPBSAG NEGATIVE 05/29/2015   HEPBCAB NON REACTIVE 06/11/2015   Hepatitis C Lab Results  Component Value Date   HEPCAB NON-REACTIVE 08/21/2020   Hepatitis A Lab Results  Component Value Date   HAV REACTIVE (A) 08/20/2021   Lipids: Lab Results  Component Value Date   CHOL 178 06/17/2017   TRIG 58 06/17/2017   HDL 46 06/17/2017   CHOLHDL 3.9 06/17/2017   VLDL 9 07/16/2016   LDLCALC 118 (H) 06/17/2017    TARGET DATE: The 9th of the month  Assessment: Roy Wheeler presents today for their Apretude  injection and to follow up for HIV PrEP. No issues with past injections.  Screened patient for acute HIV symptoms such as fatigue, muscle aches, rash, sore throat, lymphadenopathy, headache, night sweats, nausea/vomiting/diarrhea, and fever. Patient denies any symptoms.   Per [employer] guidelines, a rapid HIV test should be drawn prior to Apretude  administration. Due to state shortage of rapid HIV tests, this is temporarily unable to be done. Per decision from [facility] physicians, we will proceed with Apretude  administration at this time without a negative rapid HIV test beforehand. HIV RNA was collected today and is in process.  Administered cabotegravir  600mg /67mL in right upper outer quadrant of the gluteal muscle. Will make follow up appointments for maintenance injections every 2 months.   No known exposures to any STIs and no signs or symptoms of any STIs today. Last STI screening was in April and was negative. No new partners since last injection; will check routine STI testing today.   Plan: - Administer Apretude  600 mg x 1  - Maintenance injections scheduled for 10/2 and 12/2 with me  - Check HIV RNA, RPR, and urine/oral/rectal cytologies - Call with any issues or questions  Alan Geralds, PharmD, CPP, BCIDP, AAHIVP Clinical Pharmacist Practitioner Infectious Diseases Clinical Pharmacist [facility]

## 2023-10-13 ENCOUNTER — Ambulatory Visit: Payer: Self-pay | Admitting: Pharmacist

## 2023-10-13 ENCOUNTER — Other Ambulatory Visit: Payer: Self-pay

## 2023-10-13 ENCOUNTER — Other Ambulatory Visit (HOSPITAL_COMMUNITY)
Admission: RE | Admit: 2023-10-13 | Discharge: 2023-10-13 | Disposition: A | Discharge status: Home / Self Care | Source: Ambulatory Visit | Attending: Infectious Disease | Admitting: Infectious Disease

## 2023-10-13 DIAGNOSIS — Z113 Encounter for screening for infections with a predominantly sexual mode of transmission: Secondary | ICD-10-CM

## 2023-10-13 DIAGNOSIS — Z79899 Other long term (current) drug therapy: Secondary | ICD-10-CM

## 2023-10-13 MED ORDER — CABOTEGRAVIR ER 600 MG/3ML IM SUER
600.0000 mg | Freq: Once | INTRAMUSCULAR | Status: AC
  Administered 2023-10-13: 600 mg via INTRAMUSCULAR

## 2023-10-14 LAB — CYTOLOGY, (ORAL, ANAL, URETHRAL) ANCILLARY ONLY
Chlamydia: NEGATIVE
Chlamydia: NEGATIVE
Comment: NEGATIVE
Comment: NEGATIVE
Comment: NORMAL
Comment: NORMAL
Neisseria Gonorrhea: NEGATIVE
Neisseria Gonorrhea: NEGATIVE

## 2023-10-14 LAB — URINE CYTOLOGY ANCILLARY ONLY
Chlamydia: NEGATIVE
Comment: NEGATIVE
Comment: NORMAL
Neisseria Gonorrhea: NEGATIVE

## 2023-10-15 LAB — HIV-1 RNA QUANT-NO REFLEX-BLD
HIV 1 RNA Quant: NOT DETECTED {copies}/mL
HIV-1 RNA Quant, Log: NOT DETECTED {Log_copies}/mL

## 2023-10-15 LAB — RPR: RPR Ser Ql: NONREACTIVE

## 2023-12-07 NOTE — Progress Notes (Unsigned)
 HPI: Roy Wheeler is a 33 y.o. male who presents to the [facility] pharmacy clinic for Apretude  administration and HIV PrEP follow up.  Referring ID Physician: Dr. Fleeta Rothman  There are no active problems to display for this patient.   Patient's Medications  New Prescriptions   No medications on file  Previous Medications   BENZONATATE  (TESSALON ) 100 MG CAPSULE    Take 1 capsule (100 mg total) by mouth every 8 (eight) hours.   CABOTEGRAVIR  ER (APRETUDE ) 600 MG/3ML INJECTION    Inject 3 mLs (600 mg total) into the muscle every 2 (two) months.   DICYCLOMINE  (BENTYL ) 20 MG TABLET    Take 1 tablet (20 mg total) by mouth 2 (two) times daily.   METRONIDAZOLE  (FLAGYL ) 500 MG TABLET    Take 1 tablet (500 mg total) by mouth 2 (two) times daily.   MULTIPLE VITAMIN (MULTIVITAMIN) CAPSULE    Take 1 capsule by mouth daily.   ONDANSETRON  (ZOFRAN  ODT) 4 MG DISINTEGRATING TABLET    Take 1 tablet (4 mg total) by mouth every 8 (eight) hours as needed for nausea or vomiting.   TRAMADOL  (ULTRAM ) 50 MG TABLET    Take 1 tablet (50 mg total) by mouth every 6 (six) hours as needed.  Modified Medications   No medications on file  Discontinued Medications   No medications on file    Allergies: No Known Allergies  Past Medical History: No past medical history on file.  Social History: Social History   Socioeconomic History   Marital status: Single    Spouse name: Not on file   Number of children: Not on file   Years of education: Not on file   Highest education level: Not on file  Occupational History   Not on file  Tobacco Use   Smoking status: Never   Smokeless tobacco: Never  Substance and Sexual Activity   Alcohol use: Yes    Comment: rarely   Drug use: No   Sexual activity: Not on file  Other Topics Concern   Not on file  Social History Narrative   Not on file   Social Drivers of Health   Financial Resource Strain: Not on file  Food Insecurity: Not on file  Transportation Needs: Not  on file  Physical Activity: Not on file  Stress: Not on file  Social Connections: Not on file    Labs: Lab Results  Component Value Date   HIV1RNAQUANT NOT DETECTED 10/13/2023   HIV1RNAQUANT NOT DETECTED 08/12/2023   HIV1RNAQUANT NOT DETECTED 06/11/2023    RPR and STI Lab Results  Component Value Date   LABRPR NON-REACTIVE 10/13/2023   LABRPR NON-REACTIVE 06/11/2023   LABRPR NON-REACTIVE 04/22/2023   LABRPR NON-REACTIVE 02/11/2023   LABRPR NON-REACTIVE 10/09/2022   RPRTITER 1:1 (H) 06/11/2022   RPRTITER 1:1 (H) 02/11/2022   RPRTITER 1:1 (H) 08/20/2021   RPRTITER 1:2 (H) 02/19/2021   RPRTITER 1:2 (H) 08/21/2020    STI Results GC GC CT CT  10/13/2023  9:39 AM Negative    Negative    Negative   Negative    Negative    Negative    06/11/2023  9:46 AM Negative    Negative    Negative   Negative    Negative    Negative    04/22/2023 10:04 AM Negative    Negative    Negative   Negative    Negative    Negative    02/11/2023  9:21 AM Negative  Negative    Negative   Negative    Negative    Negative    12/10/2022  9:16 AM Negative    Negative    Negative   Negative    Negative    Negative    10/09/2022  3:30 PM Negative   Negative    08/13/2022  4:05 PM Negative    Negative    Negative   Negative    Negative    Negative    06/11/2022  4:25 PM Negative   Negative    04/17/2022  9:34 AM Negative    Negative    Negative   Negative    Negative    Negative    02/11/2022  4:27 PM Negative    Negative    Negative   Negative    Negative    Negative    12/11/2021  9:03 AM Negative   Negative    12/10/2021  4:07 PM Negative    Negative   Negative    Negative    10/17/2021  3:34 PM Negative   Negative    09/13/2021  3:21 PM Negative   Negative    08/20/2021  4:35 PM Negative    Positive   Negative    Negative    08/20/2021  3:48 PM Negative   Negative    06/21/2021  4:05 PM Negative   Negative    02/19/2021  9:56 AM Negative   Negative    02/19/2021  9:17  AM Negative   Negative    07/16/2016 10:45 AM  NOT DETECTED   NOT DETECTED     Hepatitis B Lab Results  Component Value Date   HEPBSAB POS (A) 06/11/2015   HEPBSAG NEGATIVE 05/29/2015   HEPBCAB NON REACTIVE 06/11/2015   Hepatitis C Lab Results  Component Value Date   HEPCAB NON-REACTIVE 08/21/2020   Hepatitis A Lab Results  Component Value Date   HAV REACTIVE (A) 08/20/2021   Lipids: Lab Results  Component Value Date   CHOL 178 06/17/2017   TRIG 58 06/17/2017   HDL 46 06/17/2017   CHOLHDL 3.9 06/17/2017   VLDL 9 07/16/2016   LDLCALC 118 (H) 06/17/2017    TARGET DATE: The 9th of the month  Assessment: Roy Wheeler presents today for their Apretude  injection and to follow up for HIV PrEP. No issues with past injections.  Screened patient for acute HIV symptoms such as fatigue, muscle aches, rash, sore throat, lymphadenopathy, headache, night sweats, nausea/vomiting/diarrhea, and fever. Patient denies any symptoms.   Per [employer] guidelines, a rapid HIV test should be drawn prior to Apretude  administration. Due to state shortage of rapid HIV tests, this is temporarily unable to be done. Per decision from [facility] physicians, we will proceed with Apretude  administration at this time without a negative rapid HIV test beforehand. HIV RNA was collected today and is in process.  Administered cabotegravir  600mg /35mL in right upper outer quadrant of the gluteal muscle. Will make follow up appointments for maintenance injections every 2 months.   No known exposures to any STIs and no signs or symptoms of any STIs today. Last STI screening was in August and was negative. No new partners since last injection; will check RPR and oral/rectal cytologies today.   Eligible for flu vaccine today which he accepts.   Plan: - Administer Apretude  600 mg x 1  - Maintenance injections scheduled for 12/2 with me  - Check HIV RNA, RPR, and oral/rectal cytologies - Administer flu vaccine  -  Call with  any issues or questions  Alan Geralds, PharmD, CPP, BCIDP, AAHIVP Clinical Pharmacist Practitioner Infectious Diseases Clinical Pharmacist [facility]

## 2023-12-09 ENCOUNTER — Telehealth: Payer: Self-pay

## 2023-12-09 NOTE — Telephone Encounter (Signed)
 [facility] Patient Advocate Encounter  Patient's medications APRETUDE  have been couriered to [facility] from [facility] and will be administered at the patients appointment on 12/10/23.  Charmaine Sharps, CPhT Specialty Pharmacy Patient [facility] for Infectious Disease Phone: [phone] Fax:  [phone]

## 2023-12-10 ENCOUNTER — Ambulatory Visit: Admitting: Pharmacist

## 2023-12-10 ENCOUNTER — Other Ambulatory Visit (HOSPITAL_COMMUNITY)
Admission: RE | Admit: 2023-12-10 | Discharge: 2023-12-10 | Disposition: A | Discharge status: Home / Self Care | Source: Ambulatory Visit | Attending: Infectious Disease | Admitting: Infectious Disease

## 2023-12-10 ENCOUNTER — Other Ambulatory Visit: Payer: Self-pay

## 2023-12-10 DIAGNOSIS — Z113 Encounter for screening for infections with a predominantly sexual mode of transmission: Secondary | ICD-10-CM

## 2023-12-10 DIAGNOSIS — Z79899 Other long term (current) drug therapy: Secondary | ICD-10-CM | POA: Diagnosis not present

## 2023-12-10 DIAGNOSIS — Z23 Encounter for immunization: Secondary | ICD-10-CM | POA: Diagnosis not present

## 2023-12-10 MED ORDER — CABOTEGRAVIR ER 600 MG/3ML IM SUER
600.0000 mg | Freq: Once | INTRAMUSCULAR | Status: AC
  Administered 2023-12-10: 600 mg via INTRAMUSCULAR

## 2023-12-11 LAB — CYTOLOGY, (ORAL, ANAL, URETHRAL) ANCILLARY ONLY
Chlamydia: NEGATIVE
Chlamydia: NEGATIVE
Comment: NEGATIVE
Comment: NEGATIVE
Comment: NORMAL
Comment: NORMAL
Neisseria Gonorrhea: NEGATIVE
Neisseria Gonorrhea: NEGATIVE

## 2023-12-14 LAB — RPR: RPR Ser Ql: NONREACTIVE

## 2023-12-14 LAB — HIV-1 RNA QUANT-NO REFLEX-BLD
HIV 1 RNA Quant: NOT DETECTED {copies}/mL
HIV-1 RNA Quant, Log: NOT DETECTED {Log_copies}/mL

## 2024-01-29 NOTE — Progress Notes (Signed)
 HPI: Roy Wheeler is a 33 y.o. male who presents to the [facility] pharmacy clinic for Apretude  administration and HIV PrEP follow up.  Insured   [x]    Uninsured  []    Referring ID Physician: Dr. Fleeta Rothman  There are no active problems to display for this patient.   Patient's Medications  New Prescriptions   No medications on file  Previous Medications   BENZONATATE  (TESSALON ) 100 MG CAPSULE    Take 1 capsule (100 mg total) by mouth every 8 (eight) hours.   CABOTEGRAVIR  ER (APRETUDE ) 600 MG/3ML INJECTION    Inject 3 mLs (600 mg total) into the muscle every 2 (two) months.   DICYCLOMINE  (BENTYL ) 20 MG TABLET    Take 1 tablet (20 mg total) by mouth 2 (two) times daily.   METRONIDAZOLE  (FLAGYL ) 500 MG TABLET    Take 1 tablet (500 mg total) by mouth 2 (two) times daily.   MULTIPLE VITAMIN (MULTIVITAMIN) CAPSULE    Take 1 capsule by mouth daily.   ONDANSETRON  (ZOFRAN  ODT) 4 MG DISINTEGRATING TABLET    Take 1 tablet (4 mg total) by mouth every 8 (eight) hours as needed for nausea or vomiting.   TRAMADOL  (ULTRAM ) 50 MG TABLET    Take 1 tablet (50 mg total) by mouth every 6 (six) hours as needed.  Modified Medications   No medications on file  Discontinued Medications   No medications on file    Allergies: No Known Allergies  Past Medical History: No past medical history on file.  Social History: Social History   Socioeconomic History   Marital status: Single    Spouse name: Not on file   Number of children: Not on file   Years of education: Not on file   Highest education level: Not on file  Occupational History   Not on file  Tobacco Use   Smoking status: Never   Smokeless tobacco: Never  Substance and Sexual Activity   Alcohol use: Yes    Comment: rarely   Drug use: No   Sexual activity: Not on file  Other Topics Concern   Not on file  Social History Narrative   Not on file   Social Drivers of Health   Financial Resource Strain: Not on file  Food Insecurity: Not  on file  Transportation Needs: Not on file  Physical Activity: Not on file  Stress: Not on file  Social Connections: Not on file    Labs: Lab Results  Component Value Date   HIV1RNAQUANT NOT DETECTED 12/10/2023   HIV1RNAQUANT NOT DETECTED 10/13/2023   HIV1RNAQUANT NOT DETECTED 08/12/2023    RPR and STI Lab Results  Component Value Date   LABRPR NON-REACTIVE 12/10/2023   LABRPR NON-REACTIVE 10/13/2023   LABRPR NON-REACTIVE 06/11/2023   LABRPR NON-REACTIVE 04/22/2023   LABRPR NON-REACTIVE 02/11/2023   RPRTITER 1:1 (H) 06/11/2022   RPRTITER 1:1 (H) 02/11/2022   RPRTITER 1:1 (H) 08/20/2021   RPRTITER 1:2 (H) 02/19/2021   RPRTITER 1:2 (H) 08/21/2020    STI Results GC CT  12/10/2023  9:22 AM Negative    Negative  Negative    Negative   10/13/2023  9:39 AM Negative    Negative    Negative  Negative    Negative    Negative   06/11/2023  9:46 AM Negative    Negative    Negative  Negative    Negative    Negative   04/22/2023 10:04 AM Negative    Negative  Negative  Negative    Negative    Negative   02/11/2023  9:21 AM Negative    Negative    Negative  Negative    Negative    Negative   12/10/2022  9:16 AM Negative    Negative    Negative  Negative    Negative    Negative   10/09/2022  3:30 PM Negative  Negative   08/13/2022  4:05 PM Negative    Negative    Negative  Negative    Negative    Negative   06/11/2022  4:25 PM Negative  Negative   04/17/2022  9:34 AM Negative    Negative    Negative  Negative    Negative    Negative   02/11/2022  4:27 PM Negative    Negative    Negative  Negative    Negative    Negative   12/11/2021  9:03 AM Negative  Negative   12/10/2021  4:07 PM Negative    Negative  Negative    Negative   10/17/2021  3:34 PM Negative  Negative   09/13/2021  3:21 PM Negative  Negative   08/20/2021  4:35 PM Negative    Positive  Negative    Negative   08/20/2021  3:48 PM Negative  Negative   06/21/2021  4:05 PM Negative  Negative    02/19/2021  9:56 AM Negative  Negative   02/19/2021  9:17 AM Negative  Negative     Hepatitis B Lab Results  Component Value Date   HEPBSAB POS (A) 06/11/2015   HEPBSAG NEGATIVE 05/29/2015   HEPBCAB NON REACTIVE 06/11/2015   Hepatitis C Lab Results  Component Value Date   HEPCAB NON-REACTIVE 08/21/2020   Hepatitis A Lab Results  Component Value Date   HAV REACTIVE (A) 08/20/2021   Lipids: Lab Results  Component Value Date   CHOL 178 06/17/2017   TRIG 58 06/17/2017   HDL 46 06/17/2017   CHOLHDL 3.9 06/17/2017   VLDL 9 07/16/2016   LDLCALC 118 (H) 06/17/2017    TARGET DATE: The 9th of the month  Assessment: Roy Wheeler presents today for their Apretude  injection and to follow up for HIV PrEP. No issues with past injections.  Screened patient for acute HIV symptoms such as fatigue, muscle aches, rash, sore throat, lymphadenopathy, headache, night sweats, nausea/vomiting/diarrhea, and fever. Patient denies any symptoms.   Administered cabotegravir  600mg /45mL in right upper outer quadrant of the gluteal muscle. Will make follow up appointments for maintenance injections every 2 months. Palpated large knot on right side today and avoided during administration; patient had not noticed.   No known exposures to any STIs and no signs or symptoms of any STIs today. Last STI screening was in October and was negative. No new partners since last injection; will check full STI screening today.   Discussed [place]o for PrEP. Counseled patient that they will need to complete an oral loading dose. Counseled that patient will take two Sunlenca 300 mg tablets (600 mg total) orally on the first day of their injection and will take two Sunlenca 300 mg tablets (600 mg total) orally the next day regardless of meals. Counseled patient that Sunlenca is two separate subcutaneous injections in the abdomen every 6 months. Reviewed that the main side effects are injection-site soreness and nodules.  Discussed measures for relief including cold packs and over-the-counter anti-inflammatories. Patient would like to research further and continue on Apretude  at this time.   Plan: - Administer Apretude   600 mg x 1  - Maintenance injections scheduled for 04/19/24 with me  - Check HIV RNA, RPR, and urine/oral/rectal cytologies  - Call with any issues or questions  Alan Geralds, PharmD, CPP, BCIDP, AAHIVP Clinical Pharmacist Practitioner Infectious Diseases Clinical Pharmacist [facility]

## 2024-02-02 ENCOUNTER — Telehealth: Payer: Self-pay

## 2024-02-02 NOTE — Telephone Encounter (Signed)
 [facility] Patient Advocate Encounter  Patient's medications Apretude  have been couriered to [facility] from [facility] and will be administered at the patients appointment on 02/09/24.  Arland Hutchinson, CPhT Specialty Pharmacy Patient [facility] Phone: [phone] Fax:  [phone]

## 2024-02-09 ENCOUNTER — Ambulatory Visit: Payer: Self-pay | Admitting: Pharmacist

## 2024-02-09 ENCOUNTER — Other Ambulatory Visit: Payer: Self-pay

## 2024-02-09 ENCOUNTER — Other Ambulatory Visit (HOSPITAL_COMMUNITY)
Admission: RE | Admit: 2024-02-09 | Discharge: 2024-02-09 | Disposition: A | Source: Ambulatory Visit | Attending: Infectious Disease | Admitting: Infectious Disease

## 2024-02-09 ENCOUNTER — Encounter: Payer: Self-pay | Admitting: Pharmacist

## 2024-02-09 DIAGNOSIS — Z79899 Other long term (current) drug therapy: Secondary | ICD-10-CM | POA: Diagnosis not present

## 2024-02-09 DIAGNOSIS — Z113 Encounter for screening for infections with a predominantly sexual mode of transmission: Secondary | ICD-10-CM | POA: Insufficient documentation

## 2024-02-09 MED ORDER — CABOTEGRAVIR ER 600 MG/3ML IM SUER
600.0000 mg | Freq: Once | INTRAMUSCULAR | Status: AC
  Administered 2024-02-09: 600 mg via INTRAMUSCULAR

## 2024-02-10 LAB — CYTOLOGY, (ORAL, ANAL, URETHRAL) ANCILLARY ONLY
Chlamydia: NEGATIVE
Chlamydia: NEGATIVE
Comment: NEGATIVE
Comment: NEGATIVE
Comment: NORMAL
Comment: NORMAL
Neisseria Gonorrhea: NEGATIVE
Neisseria Gonorrhea: NEGATIVE

## 2024-02-10 LAB — URINE CYTOLOGY ANCILLARY ONLY
Chlamydia: NEGATIVE
Comment: NEGATIVE
Comment: NORMAL
Neisseria Gonorrhea: NEGATIVE

## 2024-02-11 LAB — SYPHILIS: RPR W/REFLEX TO RPR TITER AND TREPONEMAL ANTIBODIES, TRADITIONAL SCREENING AND DIAGNOSIS ALGORITHM: RPR Ser Ql: NONREACTIVE

## 2024-02-11 LAB — HIV-1 RNA QUANT-NO REFLEX-BLD
HIV 1 RNA Quant: NOT DETECTED {copies}/mL
HIV-1 RNA Quant, Log: NOT DETECTED {Log_copies}/mL

## 2024-03-11 ENCOUNTER — Other Ambulatory Visit: Payer: Self-pay | Admitting: Pharmacist

## 2024-03-11 DIAGNOSIS — Z79899 Other long term (current) drug therapy: Secondary | ICD-10-CM

## 2024-04-12 ENCOUNTER — Ambulatory Visit: Admitting: Pharmacist

## 2024-04-13 ENCOUNTER — Telehealth: Payer: Self-pay

## 2024-04-13 NOTE — Telephone Encounter (Signed)
 [facility] Patient Advocate Encounter  Patient's medications APRETUDE  have been couriered to [facility] from [facility] and will be administered at the patients appointment on 04/19/24.  Charmaine Sharps, CPhT Specialty Pharmacy Patient [facility] for Infectious Disease Phone: [phone] Fax:  [phone]

## 2024-04-13 NOTE — Progress Notes (Unsigned)
 "  HPI: Roy Wheeler is a 34 y.o. male who presents to the [facility] pharmacy clinic for Apretude  administration and HIV PrEP follow up.  Insured   [x]    Uninsured  []    Referring ID Physician: Roy Fireman, NP   There are no active problems to display for this patient.   Patient's Medications  New Prescriptions   No medications on file  Previous Medications   APRETUDE  600 MG/3ML INJECTION    INJECT 3 ML INTO THE MUSCLE EVERY 2 MONTHS   BENZONATATE  (TESSALON ) 100 MG CAPSULE    Take 1 capsule (100 mg total) by mouth every 8 (eight) hours.   DICYCLOMINE  (BENTYL ) 20 MG TABLET    Take 1 tablet (20 mg total) by mouth 2 (two) times daily.   METRONIDAZOLE  (FLAGYL ) 500 MG TABLET    Take 1 tablet (500 mg total) by mouth 2 (two) times daily.   MULTIPLE VITAMIN (MULTIVITAMIN) CAPSULE    Take 1 capsule by mouth daily.   ONDANSETRON  (ZOFRAN  ODT) 4 MG DISINTEGRATING TABLET    Take 1 tablet (4 mg total) by mouth every 8 (eight) hours as needed for nausea or vomiting.   TRAMADOL  (ULTRAM ) 50 MG TABLET    Take 1 tablet (50 mg total) by mouth every 6 (six) hours as needed.  Modified Medications   No medications on file  Discontinued Medications   No medications on file    Allergies: Allergies[1]  Past Medical History: No past medical history on file.  Social History: Social History   Socioeconomic History   Marital status: Single    Spouse name: Not on file   Number of children: Not on file   Years of education: Not on file   Highest education level: Not on file  Occupational History   Not on file  Tobacco Use   Smoking status: Never   Smokeless tobacco: Never  Substance and Sexual Activity   Alcohol use: Yes    Comment: rarely   Drug use: No   Sexual activity: Not on file  Other Topics Concern   Not on file  Social History Narrative   Not on file   Social Drivers of Health   Tobacco Use: Low Risk (04/25/2023)   Received from [facility]   Patient History    Smoking  Tobacco Use: Never    Smokeless Tobacco Use: Never    Passive Exposure: Not on file  Financial Resource Strain: Not on file  Food Insecurity: Not on file  Transportation Needs: Not on file  Physical Activity: Not on file  Stress: Not on file  Social Connections: Not on file  Depression (PHQ2-9): Not on file  Alcohol Screen: Not on file  Housing: Not on file  Utilities: Not on file  Health Literacy: Not on file    Labs: Lab Results  Component Value Date   HIV1RNAQUANT NOT DETECTED 02/09/2024   HIV1RNAQUANT NOT DETECTED 12/10/2023   HIV1RNAQUANT NOT DETECTED 10/13/2023    RPR and STI Lab Results  Component Value Date   LABRPR NON-REACTIVE 02/09/2024   LABRPR NON-REACTIVE 12/10/2023   LABRPR NON-REACTIVE 10/13/2023   LABRPR NON-REACTIVE 06/11/2023   LABRPR NON-REACTIVE 04/22/2023   RPRTITER 1:1 (H) 06/11/2022   RPRTITER 1:1 (H) 02/11/2022   RPRTITER 1:1 (H) 08/20/2021   RPRTITER 1:2 (H) 02/19/2021   RPRTITER 1:2 (H) 08/21/2020    STI Results GC CT  02/09/2024  9:41 AM Negative  Negative   02/09/2024  9:40 AM Negative    Negative  Negative    Negative   12/10/2023  9:22 AM Negative    Negative  Negative    Negative   10/13/2023  9:39 AM Negative    Negative    Negative  Negative    Negative    Negative   06/11/2023  9:46 AM Negative    Negative    Negative  Negative    Negative    Negative   04/22/2023 10:04 AM Negative    Negative    Negative  Negative    Negative    Negative   02/11/2023  9:21 AM Negative    Negative    Negative  Negative    Negative    Negative   12/10/2022  9:16 AM Negative    Negative    Negative  Negative    Negative    Negative   10/09/2022  3:30 PM Negative  Negative   08/13/2022  4:05 PM Negative    Negative    Negative  Negative    Negative    Negative   06/11/2022  4:25 PM Negative  Negative   04/17/2022  9:34 AM Negative    Negative    Negative  Negative    Negative    Negative   02/11/2022  4:27 PM Negative     Negative    Negative  Negative    Negative    Negative   12/11/2021  9:03 AM Negative  Negative   12/10/2021  4:07 PM Negative    Negative  Negative    Negative   10/17/2021  3:34 PM Negative  Negative   09/13/2021  3:21 PM Negative  Negative   08/20/2021  4:35 PM Negative    Positive  Negative    Negative   08/20/2021  3:48 PM Negative  Negative   06/21/2021  4:05 PM Negative  Negative     Hepatitis B Lab Results  Component Value Date   HEPBSAB POS (A) 06/11/2015   HEPBSAG NEGATIVE 05/29/2015   HEPBCAB NON REACTIVE 06/11/2015   Hepatitis C Lab Results  Component Value Date   HEPCAB NON-REACTIVE 08/21/2020   Hepatitis A Lab Results  Component Value Date   HAV REACTIVE (A) 08/20/2021   Lipids: Lab Results  Component Value Date   CHOL 178 06/17/2017   TRIG 58 06/17/2017   HDL 46 06/17/2017   CHOLHDL 3.9 06/17/2017   VLDL 9 07/16/2016   LDLCALC 118 (H) 06/17/2017    TARGET DATE: The 9th of the month  Assessment: Roy Wheeler presents today for their Apretude  injection and to follow up for HIV PrEP. No issues with past injections.  Screened patient for acute HIV symptoms such as fatigue, muscle aches, rash, sore throat, lymphadenopathy, headache, night sweats, nausea/vomiting/diarrhea, and fever. Patient denies any symptoms.   Administered cabotegravir  600mg /71mL in *** upper outer quadrant of the gluteal muscle. Will make follow up appointments for maintenance injections every 2 months.   No known exposures to any STIs and no signs or symptoms of any STIs today. Last STI screening was in December and was negative. *** new partners since last injection; will check *** today.   Plan: - Administer Apretude  600 mg x 1  - Maintenance injections scheduled for *** - Check HIV RNA and *** - Call with any issues or questions  Roy Wheeler, PharmD, CPP, BCIDP, AAHIVP Clinical Pharmacist Practitioner Infectious Diseases Clinical Pharmacist [facility]     [1] No Known Allergies  "

## 2024-04-19 ENCOUNTER — Ambulatory Visit: Admitting: Pharmacist

## 2024-04-19 DIAGNOSIS — Z79899 Other long term (current) drug therapy: Secondary | ICD-10-CM

## 2024-04-19 DIAGNOSIS — Z113 Encounter for screening for infections with a predominantly sexual mode of transmission: Secondary | ICD-10-CM
